# Patient Record
Sex: Female | Born: 1965
Health system: Southern US, Community
[De-identification: ages and names within clinical notes are randomized; demographics above are authoritative.]

## PROBLEM LIST (undated history)

## (undated) DIAGNOSIS — I10 Essential (primary) hypertension: Secondary | ICD-10-CM

## (undated) DIAGNOSIS — J45909 Unspecified asthma, uncomplicated: Secondary | ICD-10-CM

## (undated) DIAGNOSIS — D571 Sickle-cell disease without crisis: Secondary | ICD-10-CM

## (undated) DIAGNOSIS — T7840XA Allergy, unspecified, initial encounter: Secondary | ICD-10-CM

## (undated) DIAGNOSIS — D573 Sickle-cell trait: Secondary | ICD-10-CM

## (undated) DIAGNOSIS — D649 Anemia, unspecified: Secondary | ICD-10-CM

## (undated) DIAGNOSIS — E079 Disorder of thyroid, unspecified: Secondary | ICD-10-CM

## (undated) DIAGNOSIS — K219 Gastro-esophageal reflux disease without esophagitis: Secondary | ICD-10-CM

## (undated) DIAGNOSIS — C801 Malignant (primary) neoplasm, unspecified: Secondary | ICD-10-CM

## (undated) HISTORY — DX: Sickle-cell disease without crisis: D57.1

## (undated) HISTORY — PX: ABDOMINAL HYSTERECTOMY: SHX81

## (undated) HISTORY — DX: Malignant (primary) neoplasm, unspecified: C80.1

## (undated) HISTORY — DX: Allergy, unspecified, initial encounter: T78.40XA

## (undated) HISTORY — DX: Sickle-cell trait: D57.3

## (undated) HISTORY — DX: Anemia, unspecified: D64.9

## (undated) HISTORY — PX: THYROID SURGERY: SHX805

## (undated) HISTORY — DX: Gastro-esophageal reflux disease without esophagitis: K21.9

## (undated) HISTORY — PX: NASAL SINUS SURGERY: SHX719

## (undated) HISTORY — DX: Unspecified asthma, uncomplicated: J45.909

## (undated) HISTORY — PX: OTHER SURGICAL HISTORY: SHX169

---

## 2006-09-06 ENCOUNTER — Emergency Department (HOSPITAL_COMMUNITY): Admission: EM | Admit: 2006-09-06 | Discharge: 2006-09-07 | Payer: Self-pay | Admitting: Emergency Medicine

## 2007-06-10 ENCOUNTER — Emergency Department (HOSPITAL_COMMUNITY): Admission: EM | Admit: 2007-06-10 | Discharge: 2007-06-10 | Payer: Self-pay | Admitting: Emergency Medicine

## 2008-06-25 ENCOUNTER — Emergency Department (HOSPITAL_COMMUNITY): Admission: EM | Admit: 2008-06-25 | Discharge: 2008-06-26 | Payer: Self-pay | Admitting: Emergency Medicine

## 2010-08-06 ENCOUNTER — Emergency Department (HOSPITAL_COMMUNITY): Admission: EM | Admit: 2010-08-06 | Discharge: 2010-08-06 | Payer: Self-pay | Admitting: Emergency Medicine

## 2011-02-11 ENCOUNTER — Observation Stay (HOSPITAL_COMMUNITY)
Admission: EM | Admit: 2011-02-11 | Discharge: 2011-02-12 | Disposition: A | Discharge status: Home / Self Care | Payer: Medicare Other | Attending: Family Medicine | Admitting: Family Medicine

## 2011-02-11 ENCOUNTER — Encounter: Payer: Self-pay | Admitting: Family Medicine

## 2011-02-11 ENCOUNTER — Emergency Department (HOSPITAL_COMMUNITY): Payer: Self-pay

## 2011-02-11 DIAGNOSIS — R0789 Other chest pain: Secondary | ICD-10-CM

## 2011-02-11 DIAGNOSIS — R0602 Shortness of breath: Secondary | ICD-10-CM | POA: Insufficient documentation

## 2011-02-11 DIAGNOSIS — F17200 Nicotine dependence, unspecified, uncomplicated: Secondary | ICD-10-CM

## 2011-02-11 DIAGNOSIS — E039 Hypothyroidism, unspecified: Secondary | ICD-10-CM | POA: Insufficient documentation

## 2011-02-11 DIAGNOSIS — D571 Sickle-cell disease without crisis: Secondary | ICD-10-CM

## 2011-02-11 DIAGNOSIS — R42 Dizziness and giddiness: Secondary | ICD-10-CM | POA: Insufficient documentation

## 2011-02-11 DIAGNOSIS — A599 Trichomoniasis, unspecified: Secondary | ICD-10-CM | POA: Insufficient documentation

## 2011-02-11 DIAGNOSIS — R110 Nausea: Secondary | ICD-10-CM | POA: Insufficient documentation

## 2011-02-11 LAB — CARDIAC PANEL(CRET KIN+CKTOT+MB+TROPI)
CK, MB: 1.7 ng/mL (ref 0.3–4.0)
Relative Index: 1.3 (ref 0.0–2.5)
Total CK: 130 U/L (ref 7–177)

## 2011-02-11 LAB — URINE MICROSCOPIC-ADD ON

## 2011-02-11 LAB — DIFFERENTIAL
Basophils Absolute: 0.1 10*3/uL (ref 0.0–0.1)
Basophils Relative: 1 % (ref 0–1)
Eosinophils Absolute: 0.5 10*3/uL (ref 0.0–0.7)
Eosinophils Relative: 7 % — ABNORMAL HIGH (ref 0–5)
Lymphocytes Relative: 39 % (ref 12–46)
Lymphs Abs: 2.7 10*3/uL (ref 0.7–4.0)
Monocytes Absolute: 0.4 10*3/uL (ref 0.1–1.0)
Monocytes Relative: 6 % (ref 3–12)
Neutro Abs: 3.3 10*3/uL (ref 1.7–7.7)
Neutrophils Relative %: 47 % (ref 43–77)

## 2011-02-11 LAB — POCT CARDIAC MARKERS
CKMB, poc: <1 ng/mL — ABNORMAL LOW (ref 1.0–8.0)
CKMB, poc: 1.8 ng/mL (ref 1.0–8.0)
Myoglobin, poc: 54.1 ng/mL (ref 12–200)
Myoglobin, poc: 68 ng/mL (ref 12–200)
Troponin i, poc: <0.05 ng/mL (ref 0.00–0.09)
Troponin i, poc: <0.05 ng/mL (ref 0.00–0.09)

## 2011-02-11 LAB — BASIC METABOLIC PANEL
BUN: 9 mg/dL (ref 6–23)
CO2: 26 mEq/L (ref 19–32)
Calcium: 10.1 mg/dL (ref 8.4–10.5)
Chloride: 108 mEq/L (ref 96–112)
Creatinine, Ser: 0.73 mg/dL (ref 0.4–1.2)
GFR calc non Af Amer: >60 mL/min (ref >60)
Glucose, Bld: 91 mg/dL (ref 70–99)
Potassium: 3.7 mEq/L (ref 3.5–5.1)
Sodium: 141 mEq/L (ref 135–145)

## 2011-02-11 LAB — URINALYSIS, ROUTINE W REFLEX MICROSCOPIC
Bilirubin Urine: NEGATIVE
Hgb urine dipstick: NEGATIVE
Ketones, ur: NEGATIVE mg/dL
Nitrite: NEGATIVE
Protein, ur: NEGATIVE mg/dL
Specific Gravity, Urine: 1.011 (ref 1.005–1.030)
Urine Glucose, Fasting: NEGATIVE mg/dL
Urobilinogen, UA: 0.2 mg/dL (ref 0.0–1.0)
pH: 6.5 (ref 5.0–8.0)

## 2011-02-11 LAB — CBC
HCT: 35.9 % — ABNORMAL LOW (ref 36.0–46.0)
Hemoglobin: 12.3 g/dL (ref 12.0–15.0)
MCH: 28.3 pg (ref 26.0–34.0)
MCHC: 34.3 g/dL (ref 30.0–36.0)
MCV: 82.5 fL (ref 78.0–100.0)
Platelets: 451 10*3/uL — ABNORMAL HIGH (ref 150–400)
RBC: 4.35 MIL/uL (ref 3.87–5.11)
RDW: 13.1 % (ref 11.5–15.5)
WBC: 6.9 10*3/uL (ref 4.0–10.5)

## 2011-02-11 LAB — TSH: TSH: 17.05 u[IU]/mL — ABNORMAL HIGH (ref 0.350–4.500)

## 2011-02-11 LAB — RETICULOCYTES
RBC.: 4.3 MIL/uL (ref 3.87–5.11)
Retic Count, Absolute: 38.7 10*3/uL (ref 19.0–186.0)
Retic Ct Pct: 0.9 % (ref 0.4–3.1)

## 2011-02-11 LAB — HEPATIC FUNCTION PANEL
ALT: 15 U/L (ref 0–35)
AST: 19 U/L (ref 0–37)
Albumin: 4 g/dL (ref 3.5–5.2)
Alkaline Phosphatase: 52 U/L (ref 39–117)
Bilirubin, Direct: <0.1 mg/dL (ref 0.0–0.3)
Total Bilirubin: 0.2 mg/dL — ABNORMAL LOW (ref 0.3–1.2)
Total Protein: 7.2 g/dL (ref 6.0–8.3)

## 2011-02-11 LAB — RAPID URINE DRUG SCREEN, HOSP PERFORMED
Amphetamines: NOT DETECTED
Benzodiazepines: NOT DETECTED
Cocaine: NOT DETECTED
Opiates: NOT DETECTED
Tetrahydrocannabinol: NOT DETECTED

## 2011-02-11 LAB — CK TOTAL AND CKMB (NOT AT ARMC)
CK, MB: 1.9 ng/mL (ref 0.3–4.0)
Relative Index: 1.4 (ref 0.0–2.5)
Total CK: 135 U/L (ref 7–177)

## 2011-02-11 LAB — TROPONIN I

## 2011-02-12 LAB — CBC
HCT: 36.9 % (ref 36.0–46.0)
Hemoglobin: 12.4 g/dL (ref 12.0–15.0)
MCH: 27.9 pg (ref 26.0–34.0)
MCHC: 33.6 g/dL (ref 30.0–36.0)
MCV: 82.9 fL (ref 78.0–100.0)
Platelets: 432 10*3/uL — ABNORMAL HIGH (ref 150–400)
RBC: 4.45 MIL/uL (ref 3.87–5.11)
RDW: 13.4 % (ref 11.5–15.5)
WBC: 4.7 10*3/uL (ref 4.0–10.5)

## 2011-02-12 LAB — LIPID PANEL
HDL: 36 mg/dL — ABNORMAL LOW (ref >39)
Total CHOL/HDL Ratio: 5.1 RATIO
Triglycerides: 185 mg/dL — ABNORMAL HIGH (ref <150)
VLDL: 37 mg/dL (ref 0–40)

## 2011-02-12 LAB — CARDIAC PANEL(CRET KIN+CKTOT+MB+TROPI)
CK, MB: 1.5 ng/mL (ref 0.3–4.0)
Relative Index: 1.3 (ref 0.0–2.5)
Total CK: 112 U/L (ref 7–177)

## 2011-02-12 LAB — RPR: RPR Ser Ql: NONREACTIVE

## 2011-02-12 LAB — HIV ANTIBODY (ROUTINE TESTING W REFLEX): HIV: NONREACTIVE

## 2011-02-23 NOTE — H&P (Signed)
NAMETACEY, DIMAGGIO NO.:  [id number]  MEDICAL RECORD NO.:  [id number]           PATIENT TYPE:  I  LOCATION:  2027                         FACILITY:  [facility]  PHYSICIAN:  Santiago Bumpers. Eldon Zietlow, M.D.DATE OF BIRTH:  [birth year]  DATE OF ADMISSION:  02/11/2011 DATE OF DISCHARGE:                             HISTORY & PHYSICAL   PCP:  Dr. Andrey Spearman at [facility] in [place].  CHIEF COMPLAINT:  Left-sided chest pain.  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  The patient describes the pain as feeling like pressure and sometimes sharp.  She states that the pain radiated to her left arm and to her back and her arm started to tingle.  She has associated nausea and has vomited twice today.  She drove home and took 4 tablets of aspirin and then came to the ER.  In the ER, she was given nitroglycerin sublingual and Zofran.  She states that her chest pain subsided, but is still present currently.  She does state that chest pain is relieved with belching.  PAST MEDICAL HISTORY: 1. Sickle cell disease.  The patient is not sure if she has sickle     cell trait or sickle cell disease and she is not sure which sickle     cell she has.  Her last hospitalization for pain crisis was in     February 2011 at [facility].  She has been admitted     about three times in the past 5 years for sickle cell crisis.  She     states that her pain during the crisis is surrounded by her entire     body and this pain does not feel like her normal sickle cell pain. 2. Hypothyroidism status post thyroidectomy.  PAST SURGICAL HISTORY: 1. Hysterectomy in 2002 secondary to fibroids. 2. Thyroidectomy in 2002. 3. Bilateral tubal ligation in 1992. 4. Sinus surgery in  2002.  MEDICATIONS: 1. Levothyroxine 125 mcg p.o. daily. 2. Hydroxyurea that the patient stop taking 1 year ago. 3. Vitamin D 50,000 units every Friday.  ALLERGIES:  THE PATIENT IS NOT SURE OF HER ALLERGY, BUT SHE IS ALLERGIC TO AN ANTIBIOTIC THAT STARTS WITH C AND THINKS IT IS CIPRO.  SOCIAL HISTORY:  The patient lives in [place] with her son and daughter.  She has a total of 4 children.  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  Tobacco use:  The patient smokes 5-6 cigarettes a day for 23 years.  The patient endorses occasional liquor.  Drugs:  No drugs.  FAMILY HISTORY:  The patient's mother is 56 years old and has CHF, COPD, fibromyalgia, and hypertension.  The patient's father is 3 years old and has end-stage renal disease on hemodialysis.  The patient has a sister who is healthy.  REVIEW OF SYSTEMS:  GENERAL:  Denies fevers or chills.  HEENT:  Denies headaches  or sore throat.  Cardiovascular:  Endorses chest pain.  Denies edema, orthopnea, or palpitations.  RESPIRATORY:  Denies cough, dyspnea, or wheezing.  GI:  Endorses nausea and vomiting.  Denies diarrhea, constipation, or melena.  Last bowel movement was last night, which was normal.  SKIN:  Denies rashes or ecchymoses.  MUSCULOSKELETAL:  Denies myalgia or swelling.  NEURO:  Denies visual changes, weakness, or numbness.  HEME:  Denies bleeding or easy bruising.  ENDOCRINE:  Denies polyuria or polydipsia.  PHYSICAL EXAM:  VITAL SIGNS:  Temperature 98.1, pulse 74, respiratory rate 18, blood pressure 138/97, O2 sat 100% room air. GENERAL:  No apparent distress, appropriate throughout exam. HEENT:  Normocephalic, atraumatic.  Extraocular motility intact.  Pupils equal, round, reactive to light and accommodation.  Moist mucous membranes. NECK:  No cervical nodes.  No masses.  Full range of motion. CHEST:  Palpable tenderness to the left side of the chest. CARDIOVASCULAR:  Regular  rate and rhythm.  S1 and S2.  No gallops or murmurs.  No bruits. LUNGS:  Clear to auscultation bilaterally.  No wheezing, rales, or rhonchi. ABDOMEN:  Nontender, nondistended.  Positive bowel sounds.  No masses. BACK:  Nontender to palpation. EXTREMITIES:  No cyanosis, clubbing, or edema.  +3 pulses. NEURO:  Alert and oriented x3, nonfocal.  STUDIES AND LABS:  CBC with white blood cell 6.9, hemoglobin 12.3, hematocrit 35.9, platelets 451, ANC of 3.3, and MCV of 82.5.  CMET showed sodium 141, potassium 3.7, chloride 108, CO2 of 26, BUN 9, creatinine 0.7, glucose 91, calcium 10.1, total bilirubin 0.2, direct bilirubin less than 0.1, alkaline phosphatase 52, total protein 7.2, albumin 4.0, AST 19, ALT 4.0.  Retic with 0.9% retic and absolute retic count of 38.7.  Point-of-care cardiac enzymes x2 were negative. Urinalysis shows positive for small leuks with urinary micro that shows few squamous epithelium, 7-10 white blood cell, rare bacteria, positive for trace.  Chest x-ray showed no active cardiopulmonary disease.  EKG with heart rate 77, normal sinus rhythm.  No ST wave abnormality.  ASSESSMENT/PLAN:  This is a 45 year old female with sickle cell disease, who has left-sided chest pain. 1. Left-sided chest pain.  Differential diagnosis include ACS versus     infection versus stress versus musculoskeletal pain.  The patient     has two point-of-care cardiac enzymes done that are negative, an     EKG that does not show ST wave abnormalities, and a chest x-ray     that did not show any active cardiopulmonary disease.  We will     admit the patient to telemetry and cycle cardiac enzymes x2 set and     repeat EKG in the morning.  We will get an urinary drug screen and     if negative, we will consider adding a beta-blocker for     cardioprotection.  We will also get a fasting lipid panel in the     morning for risk stratification.  The patient will be placed on 81     mg of aspirin  daily. 2. Hypothyroidism.  The patient is taking levothyroxine 125 mcg daily.     We will continue this and check TSH. 3. Sickle cell disease.  Hemoglobin is stable.  Retic count is within     normal limits.  The patient is currently not considered to be in     pain crisis.  We will continue to monitor the patient.  We will not     restart hydroxyurea since the  patient has stopped taking this a     year ago. 4. Trichomonas.  The patient has positive Trichomonas in urine.  We     will treat with Flagyl 2000 mg p.o. x1.  We will also check for     other sexually transmitted diseases including human     immunodeficiency virus, syphilis, Chlamydia, and gonorrhea.  We     will discuss with the patient that she should get her partner     treated or else she is at risk of re-infection. 5. Fluids, electrolytes, nutrition/gastrointestinal.  Heart-healthy     diet.  Hep-Lock IV. 6. Deep venous thrombosis prophylaxis.  Heparin 5000 units subcu q.8     h. 7. Code status.  The patient is full code. 8. Disposition likely in the morning after ACS has been ruled out.     Cat Ta, MD   ______________________________ Santiago Bumpers Leveda Anna, M.D.    CT/MEDQ  D:  02/11/2011  T:  02/11/2011  Job:  [id number]  cc:   Dr. Andrey Spearman  Electronically Signed by CAT TA MD on 02/19/2011 10:40:49 AM Electronically Signed by Doralee Albino M.D. on 02/23/2011 10:22:10 AM

## 2011-03-02 NOTE — Discharge Summary (Signed)
Lisa Mullen, Lisa Mullen NO.:  [id number]  MEDICAL RECORD NO.:  [id number]           PATIENT TYPE:  I  LOCATION:  2027                         FACILITY:  [facility]  PHYSICIAN:  Santiago Bumpers. Leondra Cullin, M.D.DATE OF BIRTH:  [birth year]  DATE OF ADMISSION:  02/11/2011 DATE OF DISCHARGE:  02/12/2011                              DISCHARGE SUMMARY   PRIMARY CARE PROVIDER:  [facility] in [place], [place].  DISCHARGE DIAGNOSES: 1. Musculoskeletal chest pain, noncardiac. 2. Hypothyroidism, uncontrolled. 3. Tobacco dependence.  DISCHARGE MEDICATIONS: 1. Aspirin 81 mg p.o. daily. 2. Levothyroxine 125 mcg p.o. daily. 3. Vicodin 500 mg p.o. q. 4 p.r.n. for pain.  PERTINENT LAB VALUES:  On February 11, 2011, CBC with differential was unremarkable.  Basic metabolic panel was unremarkable.  Hepatic function panel was unremarkable.  Cardiac enzymes on February 11, 2011, point-of- care cardiac enzymes, CK-MB less than 1, troponin-I  less than 0.05, myoglobin 54.1.  Second set of cardiac enzymes on February 11, 2011, CK 135, CK-MB 1.9, relative index 1.4, troponin-I 0.02.  Third set of cardiac enzymes on February 11, 2011, CK 130, CK-MB 1.7, relative index 1.3, troponin-I less than 0.01. On February 11, 2011, a TSH was 17.050. February 12, 2011, fasting lipid panel, total cholesterol 182, triglycerides 185, HDL 36, LDL 109, VLDL 37, ratio 5.1.  Radiology on February 11, 2011, a two-view chest x-ray shows no active cardiopulmonary disease.  BRIEF HOSPITAL COURSE:  Ms. Huseman is a 45 year old female with past medical history of hemoglobin [place] disease, who presented to the hospital complaining of chest pain and right neck pain. 1. Chest pain.  The patient was admitted for rule out acute coronary     syndrome.  Her EKG within normal limits.  Her chest x-ray showed no     acute findings.  Cardiac enzymes were cycled and were negative x3.     Her chest pain resolved  during hospitalization.  She was risk     stratified and her lipid panel showed a low HDL and mildly elevated     LDL.  We would recommend diet and exercise for control of mild     hyperlipidemia.  The patient is also a smoker, which is her biggest     risk factor for coronary artery disease.  She is trying to quit and     received positive reinforcement for trying to quit as well as     advised her smoking cessation. 2. Hypothyroidism.  The patient has a history of hypothyroidism and     had an elevated TSH on admission showing poor control.  The patient     said that she had only recently had an increase in her     levothyroxine dose and also admits to occasionally missing doses     due to a hectic work schedule.  We would advise her PCP to follow     up on her TSH and try to achieve better control of her     hypothyroidism as this could be a cause of her presenting symptoms     to the hospital. 3.  At risk for STDs.  The patient's urinalysis showed Trichomonas, but     no other abnormalities.  She was treated here in the hospital for     Trichomonas with 2 g of Flagyl x1.  We would recommend her PCP     follow up for other STD testing and treatment if necessary. 4. Stress.  The patient reports being under a lot of stress lately,     working many hours a week as well as having some legal issues.  We     would just recommend that her primary care provider follow up on     her stress level and assist her with management.  The patient was     discharged to home in stable medical condition.    ______________________________ Ardyth Gal, MD   ______________________________ Santiago Bumpers. Leveda Anna, M.D.    CR/MEDQ  D:  02/12/2011  T:  02/13/2011  Job:  [id number]  cc:   [facility]  Electronically Signed by Ardyth Gal MD on 03/01/2011 12:04:40 PM Electronically Signed by Doralee Albino M.D. on 03/02/2011 09:09:30 AM

## 2011-09-17 LAB — POCT I-STAT, CHEM 8
BUN: 8
Calcium, Ion: 1.21
Chloride: 107
Creatinine, Ser: 0.9
Glucose, Bld: 83
HCT: 46
Hemoglobin: 15.6 — ABNORMAL HIGH
Potassium: 3.6
Sodium: 141
TCO2: 26

## 2011-09-17 LAB — URINALYSIS, ROUTINE W REFLEX MICROSCOPIC
Bilirubin Urine: NEGATIVE
Glucose, UA: NEGATIVE
Ketones, ur: NEGATIVE
Leukocytes, UA: NEGATIVE
Nitrite: NEGATIVE
Protein, ur: NEGATIVE
Specific Gravity, Urine: 1.014
Urobilinogen, UA: 1
pH: 5.5

## 2011-09-17 LAB — DIFFERENTIAL
Basophils Absolute: 0
Basophils Relative: 0
Eosinophils Absolute: 0.4
Eosinophils Relative: 8 — ABNORMAL HIGH
Lymphocytes Relative: 13
Lymphs Abs: 0.7
Monocytes Absolute: 0.3
Monocytes Relative: 5
Neutro Abs: 4.2
Neutrophils Relative %: 74

## 2011-09-17 LAB — CBC
HCT: 42.4
Hemoglobin: 14.2
MCHC: 33.4
MCV: 84.8
Platelets: 386
RBC: 5
RDW: 13.9
WBC: 5.6

## 2011-09-17 LAB — RETICULOCYTES
RBC.: 4.95
Retic Count, Absolute: 34.7
Retic Ct Pct: 0.7

## 2011-09-17 LAB — URINE MICROSCOPIC-ADD ON

## 2011-10-07 LAB — CBC
HCT: 38.4
Hemoglobin: 12.6
MCHC: 33
MCV: 86.1
Platelets: 447 — ABNORMAL HIGH
RBC: 4.45
RDW: 13.1
WBC: 7.4

## 2011-10-07 LAB — DIFFERENTIAL
Basophils Absolute: 0
Basophils Relative: 1
Eosinophils Absolute: 0.7
Eosinophils Relative: 9 — ABNORMAL HIGH
Lymphocytes Relative: 41
Lymphs Abs: 3
Monocytes Absolute: 0.5
Monocytes Relative: 7
Neutro Abs: 3.1
Neutrophils Relative %: 42 — ABNORMAL LOW

## 2012-03-14 ENCOUNTER — Observation Stay (HOSPITAL_COMMUNITY)
Admission: EM | Admit: 2012-03-14 | Discharge: 2012-03-15 | Disposition: A | Discharge status: Home / Self Care | Payer: BC Managed Care – PPO | Attending: Emergency Medicine | Admitting: Emergency Medicine

## 2012-03-14 ENCOUNTER — Other Ambulatory Visit: Payer: Self-pay

## 2012-03-14 ENCOUNTER — Emergency Department (HOSPITAL_COMMUNITY): Payer: BC Managed Care – PPO

## 2012-03-14 DIAGNOSIS — R0602 Shortness of breath: Secondary | ICD-10-CM | POA: Insufficient documentation

## 2012-03-14 DIAGNOSIS — R209 Unspecified disturbances of skin sensation: Secondary | ICD-10-CM | POA: Insufficient documentation

## 2012-03-14 DIAGNOSIS — R079 Chest pain, unspecified: Principal | ICD-10-CM | POA: Insufficient documentation

## 2012-03-14 LAB — BASIC METABOLIC PANEL
BUN: 13 mg/dL (ref 6–23)
CO2: 23 mEq/L (ref 19–32)
Calcium: 9.3 mg/dL (ref 8.4–10.5)
Chloride: 103 mEq/L (ref 96–112)
Creatinine, Ser: 0.74 mg/dL (ref 0.50–1.10)
GFR calc Af Amer: >90 mL/min (ref >90)
GFR calc non Af Amer: >90 mL/min (ref >90)
Glucose, Bld: 95 mg/dL (ref 70–99)
Potassium: 3.6 mEq/L (ref 3.5–5.1)
Sodium: 137 mEq/L (ref 135–145)

## 2012-03-14 LAB — TROPONIN I: Troponin I: <0.3 ng/mL (ref <0.30)

## 2012-03-14 LAB — POCT I-STAT TROPONIN I: Troponin i, poc: 0 ng/mL (ref 0.00–0.08)

## 2012-03-14 LAB — CBC
HCT: 36.7 % (ref 36.0–46.0)
Hemoglobin: 12.8 g/dL (ref 12.0–15.0)
MCH: 28.6 pg (ref 26.0–34.0)
MCHC: 34.9 g/dL (ref 30.0–36.0)
MCV: 81.9 fL (ref 78.0–100.0)
Platelets: 391 10*3/uL (ref 150–400)
RBC: 4.48 MIL/uL (ref 3.87–5.11)
RDW: 13.3 % (ref 11.5–15.5)
WBC: 6.8 10*3/uL (ref 4.0–10.5)

## 2012-03-14 LAB — DIFFERENTIAL
Basophils Absolute: 0.1 10*3/uL (ref 0.0–0.1)
Basophils Relative: 1 % (ref 0–1)
Eosinophils Absolute: 0.5 10*3/uL (ref 0.0–0.7)
Eosinophils Relative: 8 % — ABNORMAL HIGH (ref 0–5)
Lymphocytes Relative: 41 % (ref 12–46)
Lymphs Abs: 2.8 10*3/uL (ref 0.7–4.0)
Monocytes Absolute: 0.5 10*3/uL (ref 0.1–1.0)
Monocytes Relative: 7 % (ref 3–12)
Neutro Abs: 2.9 10*3/uL (ref 1.7–7.7)
Neutrophils Relative %: 43 % (ref 43–77)

## 2012-03-14 MED ORDER — MORPHINE SULFATE 4 MG/ML IJ SOLN
INTRAMUSCULAR | Status: AC
  Administered 2012-03-14: 4 mg via INTRAVENOUS
  Filled 2012-03-14: qty 1

## 2012-03-14 MED ORDER — ACETAMINOPHEN 325 MG PO TABS
650.0000 mg | ORAL_TABLET | Freq: Once | ORAL | Status: AC
  Administered 2012-03-14: 650 mg via ORAL
  Filled 2012-03-14: qty 2

## 2012-03-14 MED ORDER — MORPHINE SULFATE 4 MG/ML IJ SOLN
4.0000 mg | Freq: Once | INTRAMUSCULAR | Status: AC
  Administered 2012-03-14: 4 mg via INTRAVENOUS

## 2012-03-14 MED ORDER — ASPIRIN 81 MG PO CHEW
324.0000 mg | CHEWABLE_TABLET | Freq: Once | ORAL | Status: AC
  Administered 2012-03-14: 324 mg via ORAL
  Filled 2012-03-14: qty 4

## 2012-03-14 MED ORDER — NITROGLYCERIN 0.4 MG SL SUBL
0.4000 mg | SUBLINGUAL_TABLET | SUBLINGUAL | Status: AC | PRN
  Administered 2012-03-14 (×3): 0.4 mg via SUBLINGUAL
  Filled 2012-03-14: qty 25

## 2012-03-14 NOTE — ED Notes (Signed)
MD in to reassess - plan of care discussed

## 2012-03-14 NOTE — ED Notes (Signed)
[place] sandwich, pretzels, apple sauce, cookies, sprite in ice given; awaiting CDU bed availability - pt aware of same

## 2012-03-14 NOTE — ED Notes (Signed)
Patient transported to X-ray 

## 2012-03-14 NOTE — ED Provider Notes (Signed)
History     CSN: [id number]  Arrival date & time 03/14/12  1339   First MD Initiated Contact with Patient 03/14/12 1654      Chief Complaint  Patient presents with  . Chest Pain    SHOB, tingling in LT arm    (Consider location/radiation/quality/duration/timing/severity/associated sxs/prior treatment) Patient is a 46 y.o. female presenting with chest pain. The history is provided by the patient.  Chest Pain The chest pain began 12 - 24 hours ago. Chest pain occurs frequently. The chest pain is improving. The pain is currently at 5/10. The severity of the pain is moderate. The quality of the pain is described as pressure-like. The pain radiates to the left jaw, left shoulder and upper back. Primary symptoms include shortness of breath (intermittent). Pertinent negatives for primary symptoms include no fever, no cough, no abdominal pain, no nausea and no vomiting. She tried aspirin for the symptoms.     No past medical history on file.  No past surgical history on file.  No family history on file.  History  Substance Use Topics  . Smoking status: Not on file  . Smokeless tobacco: Not on file  . Alcohol Use: Not on file    OB History    No data available      Review of Systems  Constitutional: Negative for fever and chills.  HENT: Negative for congestion and rhinorrhea.   Respiratory: Positive for shortness of breath (intermittent). Negative for cough.   Cardiovascular: Positive for chest pain. Negative for leg swelling.  Gastrointestinal: Negative for nausea, vomiting, abdominal pain and constipation.  Genitourinary: Negative for urgency, decreased urine volume and difficulty urinating.  Skin: Negative for wound.  Neurological: Negative for headaches.  Psychiatric/Behavioral: Negative for confusion.  All other systems reviewed and are negative.    Allergies  Ciprofloxacin  Home Medications   Current Outpatient Rx  Name Route Sig Dispense Refill  . ASPIRIN 81  MG PO TABS Oral Take 324 mg by mouth once.    Marland Kitchen LEVOTHYROXINE SODIUM 150 MCG PO TABS Oral Take 150 mcg by mouth daily.      BP 123/81  Pulse 89  Temp(Src) 98.2 F (36.8 C) (Oral)  SpO2 99%  Physical Exam  Nursing note and vitals reviewed. Constitutional: She is oriented to person, place, and time. She appears well-developed and well-nourished. No distress.  HENT:  Head: Normocephalic and atraumatic.  Right Ear: External ear normal.  Left Ear: External ear normal.  Nose: Nose normal.  Mouth/Throat: Oropharynx is clear and moist.  Neck: Neck supple.  Cardiovascular: Normal rate, regular rhythm, normal heart sounds and intact distal pulses.   Pulmonary/Chest: Effort normal and breath sounds normal. No respiratory distress. She has no wheezes. She has no rales.         TTP in left upper chest, worse with movement of left shoulder  Abdominal: Soft. She exhibits no distension. There is no tenderness.  Musculoskeletal: She exhibits no edema.  Lymphadenopathy:    She has no cervical adenopathy.  Neurological: She is alert and oriented to person, place, and time.  Skin: Skin is warm and dry. She is not diaphoretic. No pallor.    ED Course  Procedures (including critical care time)  Labs Reviewed  DIFFERENTIAL - Abnormal; Notable for the following:    Eosinophils Relative 8 (*)    All other components within normal limits  CBC  BASIC METABOLIC PANEL  TROPONIN I  POCT I-STAT TROPONIN I   Dg Chest 2  View  03/14/2012  *RADIOLOGY REPORT*  Clinical Data: Left-sided chest pain and tightness.  Blurry vision. Vomiting.  Shortness of breath.  CHEST - 2 VIEW  Comparison: 02/11/2011  Findings: Cardiomediastinal silhouette is within normal limits. The lungs are free of focal consolidations and pleural effusions. No edema. Visualized osseous structures have a normal appearance.  IMPRESSION: No evidence for acute cardiopulmonary abnormality.  Original Report Authenticated By: Patterson Hammersmith, M.D.     Date: 03/14/2012  Rate: 83  Rhythm: normal sinus rhythm  QRS Axis: normal  Intervals: normal  ST/T Wave abnormalities: normal  Conduction Disutrbances:none  Narrative Interpretation:   Old EKG Reviewed: none available   1. Chest pain       MDM  45 yo female with intermittent chest pressure for past 15 hours. Not worse with exertion. EKG ok, trop neg. No PNA or PTX. Pain better with NTG and morphine. Doubt ACS based on neg trop and norm EKG, but due to risk factors with family history, will place in CDU obs for rule out of troponin and likely a coronary CT        Pricilla Loveless, MD 03/14/12 2338

## 2012-03-14 NOTE — ED Provider Notes (Signed)
1930  Report received from Dr. Judd Lien.  Patient will be coming to CDU for CP protocol.  For CT heart in am. BMI 29.7.   Pain in upper back presently.  11:30pm  Report given to Dr. Judd Lien.    Jethro Bastos, NP 03/15/12 [phone]

## 2012-03-14 NOTE — ED Notes (Addendum)
Patient transported from X-ray 

## 2012-03-14 NOTE — ED Notes (Signed)
Pain to upper chest - onset 0100 this am while at work; states pain rad. Into bilat shoulder blades; states then "things started getting foggy" and she felt as if she was going to pass out; also endorses n/v, sob and diaphoresis; neg cardiac hx per pt; states  Pain presently 7/10 described as "pressure" - worse in upper to mid back area

## 2012-03-15 ENCOUNTER — Observation Stay (HOSPITAL_COMMUNITY): Payer: BC Managed Care – PPO

## 2012-03-15 ENCOUNTER — Encounter (HOSPITAL_COMMUNITY): Payer: Self-pay

## 2012-03-15 ENCOUNTER — Other Ambulatory Visit: Payer: Self-pay

## 2012-03-15 LAB — POCT I-STAT TROPONIN I: Troponin i, poc: 0 ng/mL (ref 0.00–0.08)

## 2012-03-15 MED ORDER — METOPROLOL TARTRATE 1 MG/ML IV SOLN
5.0000 mg | Freq: Once | INTRAVENOUS | Status: AC
  Administered 2012-03-15: 5 mg via INTRAVENOUS

## 2012-03-15 MED ORDER — METOPROLOL TARTRATE 1 MG/ML IV SOLN
INTRAVENOUS | Status: AC
  Filled 2012-03-15: qty 10

## 2012-03-15 MED ORDER — NITROGLYCERIN 0.4 MG SL SUBL
SUBLINGUAL_TABLET | SUBLINGUAL | Status: AC
  Filled 2012-03-15: qty 25

## 2012-03-15 MED ORDER — NITROGLYCERIN 0.4 MG SL SUBL
0.4000 mg | SUBLINGUAL_TABLET | Freq: Once | SUBLINGUAL | Status: AC
  Administered 2012-03-15: 0.4 mg via SUBLINGUAL

## 2012-03-15 MED ORDER — METOPROLOL TARTRATE 25 MG PO TABS
ORAL_TABLET | ORAL | Status: AC
  Administered 2012-03-15: 50 mg
  Filled 2012-03-15: qty 2

## 2012-03-15 MED ORDER — METOPROLOL TARTRATE 25 MG PO TABS
50.0000 mg | ORAL_TABLET | Freq: Once | ORAL | Status: AC
  Administered 2012-03-15: 50 mg via ORAL
  Filled 2012-03-15: qty 2

## 2012-03-15 MED ORDER — IOHEXOL 350 MG/ML SOLN
80.0000 mL | Freq: Once | INTRAVENOUS | Status: AC | PRN
  Administered 2012-03-15: 80 mL via INTRAVENOUS

## 2012-03-15 NOTE — Progress Notes (Signed)
Observation review is complete. 

## 2012-03-15 NOTE — Discharge Instructions (Signed)
Chest Pain (Nonspecific) It is often hard to give a specific diagnosis for the cause of chest pain. There is always a chance that your pain could be related to something serious, such as a heart attack or a blood clot in the lungs. You need to follow up with your caregiver for further evaluation. CAUSES   Heartburn.   Pneumonia or bronchitis.   Anxiety or stress.   Inflammation around your heart (pericarditis) or lung (pleuritis or pleurisy).   A blood clot in the lung.   A collapsed lung (pneumothorax). It can develop suddenly on its own (spontaneous pneumothorax) or from injury (trauma) to the chest.   Shingles infection (herpes zoster virus).  The chest wall is composed of bones, muscles, and cartilage. Any of these can be the source of the pain.  The bones can be bruised by injury.   The muscles or cartilage can be strained by coughing or overwork.   The cartilage can be affected by inflammation and become sore (costochondritis).  DIAGNOSIS  Lab tests or other studies, such as X-rays, electrocardiography, stress testing, or cardiac imaging, may be needed to find the cause of your pain.  TREATMENT   Treatment depends on what may be causing your chest pain. Treatment may include:   Acid blockers for heartburn.   Anti-inflammatory medicine.   Pain medicine for inflammatory conditions.   Antibiotics if an infection is present.   You may be advised to change lifestyle habits. This includes stopping smoking and avoiding alcohol, caffeine, and chocolate.   You may be advised to keep your head raised (elevated) when sleeping. This reduces the chance of acid going backward from your stomach into your esophagus.   Most of the time, nonspecific chest pain will improve within 2 to 3 days with rest and mild pain medicine.  HOME CARE INSTRUCTIONS   If antibiotics were prescribed, take your antibiotics as directed. Finish them even if you start to feel better.   For the next few  days, avoid physical activities that bring on chest pain. Continue physical activities as directed.   Do not smoke.   Avoid drinking alcohol.   Only take over-the-counter or prescription medicine for pain, discomfort, or fever as directed by your caregiver.   Follow your caregiver's suggestions for further testing if your chest pain does not go away.   Keep any follow-up appointments you made. If you do not go to an appointment, you could develop lasting (chronic) problems with pain. If there is any problem keeping an appointment, you must call to reschedule.  SEEK MEDICAL CARE IF:   You think you are having problems from the medicine you are taking. Read your medicine instructions carefully.   Your chest pain does not go away, even after treatment.   You develop a rash with blisters on your chest.  SEEK IMMEDIATE MEDICAL CARE IF:   You have increased chest pain or pain that spreads to your arm, neck, jaw, back, or abdomen.   You develop shortness of breath, an increasing cough, or you are coughing up blood.   You have severe back or abdominal pain, feel nauseous, or vomit.   You develop severe weakness, fainting, or chills.   You have a fever.  THIS IS AN EMERGENCY. Do not wait to see if the pain will go away. Get medical help at once. Call your local emergency services (911 in [place] Do not drive yourself to the hospital. MAKE SURE YOU:   Understand these instructions.     Will watch your condition.   Will get help right away if you are not doing well or get worse.  Document Released: 09/16/2005 Document Revised: 11/26/2011 Document Reviewed: 07/12/2008 Exit[employer] Patient Information 2012 [employer]. 

## 2012-03-15 NOTE — ED Notes (Signed)
Pt up ambulatory to bathroom without any problems.  Pt continues to be pain free, family at bedside.

## 2012-03-15 NOTE — ED Notes (Signed)
Pt's IVs in left and right ACs d/c'd also from the monitor, continuous pulse oximetry and blood pressure cuff; pt getting dressed to be discharged home; family at bedside

## 2012-03-15 NOTE — ED Notes (Signed)
Patient returned to room. Tolerated ct well

## 2012-03-15 NOTE — ED Notes (Signed)
Pt resting with eyes closed.  Cardiac Monitor NSR.  Lights turned down call light at bedside, SL in place in left [facility]

## 2012-03-15 NOTE — ED Provider Notes (Signed)
Medical screening examination/treatment/procedure(s) were performed by non-physician practitioner and as supervising physician I was immediately available for consultation/collaboration.  Geoffery Lyons, MD 03/15/12 1229

## 2012-03-15 NOTE — ED Notes (Signed)
PT HEART RATE NOTED UP IN 70-80 WHEN AWAKE AND MOVING AROUND. SPOKE WITH KELLY IN CT AND WILL GIVE PT ADDITIONAL METOPROLOL TO EQUAL FULL DOSE FOR RATE CONTROL FOR CT STUDY SCHEDULED THIS MORNING

## 2012-03-15 NOTE — ED Notes (Signed)
BMI CALCULATED AT 29.7

## 2012-03-15 NOTE — ED Notes (Signed)
Pa has been in to discuss results with pt

## 2012-03-15 NOTE — ED Provider Notes (Signed)
I saw and evaluated the patient, reviewed the resident's note and I agree with the findings and plan.  I saw the patient along with Alric Ran and agree with his note, assessment, and plan.  The patient presents with chest pain.  On exam, the heart and lungs are unremarkable and abdomen is benign.  The workup was unremarkable including ekg, cardiac enzymes, and chest xray.  She will be placed in the CDU for rule out of mi and cardiac ct in the am.    Geoffery Lyons, MD 03/15/12 1325

## 2012-03-15 NOTE — ED Provider Notes (Signed)
CTA completely normal per Dr. Azell Der. Patient is asymptomatic and will be discharged home.  Rodena Medin, PA-C 03/15/12 1055

## 2012-08-06 ENCOUNTER — Encounter (HOSPITAL_COMMUNITY): Payer: Self-pay | Admitting: *Deleted

## 2012-08-06 ENCOUNTER — Emergency Department (HOSPITAL_COMMUNITY)
Admission: EM | Admit: 2012-08-06 | Discharge: 2012-08-06 | Disposition: A | Discharge status: Home / Self Care | Payer: BC Managed Care – PPO | Attending: Emergency Medicine | Admitting: Emergency Medicine

## 2012-08-06 DIAGNOSIS — E079 Disorder of thyroid, unspecified: Secondary | ICD-10-CM | POA: Insufficient documentation

## 2012-08-06 DIAGNOSIS — F17200 Nicotine dependence, unspecified, uncomplicated: Secondary | ICD-10-CM | POA: Insufficient documentation

## 2012-08-06 DIAGNOSIS — D571 Sickle-cell disease without crisis: Secondary | ICD-10-CM | POA: Insufficient documentation

## 2012-08-06 DIAGNOSIS — M25519 Pain in unspecified shoulder: Secondary | ICD-10-CM | POA: Insufficient documentation

## 2012-08-06 DIAGNOSIS — R109 Unspecified abdominal pain: Secondary | ICD-10-CM | POA: Insufficient documentation

## 2012-08-06 DIAGNOSIS — M62830 Muscle spasm of back: Secondary | ICD-10-CM

## 2012-08-06 HISTORY — DX: Disorder of thyroid, unspecified: E07.9

## 2012-08-06 HISTORY — DX: Sickle-cell disease without crisis: D57.1

## 2012-08-06 MED ORDER — IBUPROFEN 800 MG PO TABS
800.0000 mg | ORAL_TABLET | Freq: Once | ORAL | Status: AC
  Administered 2012-08-06: 800 mg via ORAL
  Filled 2012-08-06: qty 1

## 2012-08-06 MED ORDER — DIAZEPAM 5 MG PO TABS
5.0000 mg | ORAL_TABLET | Freq: Once | ORAL | Status: AC
  Administered 2012-08-06: 5 mg via ORAL
  Filled 2012-08-06: qty 1

## 2012-08-06 MED ORDER — IBUPROFEN 800 MG PO TABS: 800.0000 mg | ORAL_TABLET | Freq: Three times a day (TID) | ORAL | Status: AC

## 2012-08-06 MED ORDER — DIAZEPAM 5 MG PO TABS: 5.0000 mg | ORAL_TABLET | Freq: Three times a day (TID) | ORAL | Status: AC | PRN

## 2012-08-06 MED ORDER — OXYCODONE-ACETAMINOPHEN 5-325 MG PO TABS
2.0000 | ORAL_TABLET | Freq: Once | ORAL | Status: AC
  Administered 2012-08-06: 2 via ORAL
  Filled 2012-08-06: qty 2

## 2012-08-06 MED ORDER — OXYCODONE-ACETAMINOPHEN 5-325 MG PO TABS: 2.0000 | ORAL_TABLET | ORAL | Status: AC | PRN
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
## 2012-08-06 NOTE — ED Notes (Addendum)
C/o L shoulder pain & neck pain. Also L scapula, L axilla throbbing, describes as severe, spasms and popping. Scattered and sporadic. Constant now. Worse with certain positions and with certain movements. Works as a Lawyer taking care of pts. Onset at work around 2130. Took 4 ibuprofen at 2130 & 4 more at 0130.

## 2012-08-06 NOTE — Progress Notes (Signed)
Orthopedic Tech Progress Note Patient Details:  Lisa Mullen 07-15-1966 [id number] Sling Immobilizer applied to Left UE, tolerated well Ortho Devices Type of Ortho Device: Sling immobilizer Ortho Device/Splint Location: Shoulder Sling Immobilizer applied to Left UE Ortho Device/Splint Interventions: Application   Lisa Mullen 08/06/2012, 6:34 AM

## 2012-12-04 ENCOUNTER — Emergency Department (HOSPITAL_COMMUNITY): Payer: BC Managed Care – PPO

## 2012-12-04 ENCOUNTER — Encounter (HOSPITAL_COMMUNITY): Payer: Self-pay | Admitting: Neurology

## 2012-12-04 ENCOUNTER — Emergency Department (HOSPITAL_COMMUNITY)
Admission: EM | Admit: 2012-12-04 | Discharge: 2012-12-04 | Disposition: A | Discharge status: Home / Self Care | Payer: BC Managed Care – PPO | Attending: Emergency Medicine | Admitting: Emergency Medicine

## 2012-12-04 DIAGNOSIS — D571 Sickle-cell disease without crisis: Secondary | ICD-10-CM | POA: Insufficient documentation

## 2012-12-04 DIAGNOSIS — F17200 Nicotine dependence, unspecified, uncomplicated: Secondary | ICD-10-CM | POA: Insufficient documentation

## 2012-12-04 DIAGNOSIS — J189 Pneumonia, unspecified organism: Secondary | ICD-10-CM

## 2012-12-04 DIAGNOSIS — R1110 Vomiting, unspecified: Secondary | ICD-10-CM

## 2012-12-04 DIAGNOSIS — R197 Diarrhea, unspecified: Secondary | ICD-10-CM | POA: Insufficient documentation

## 2012-12-04 DIAGNOSIS — R0602 Shortness of breath: Secondary | ICD-10-CM | POA: Insufficient documentation

## 2012-12-04 DIAGNOSIS — J029 Acute pharyngitis, unspecified: Secondary | ICD-10-CM | POA: Insufficient documentation

## 2012-12-04 DIAGNOSIS — J3489 Other specified disorders of nose and nasal sinuses: Secondary | ICD-10-CM | POA: Insufficient documentation

## 2012-12-04 DIAGNOSIS — R5381 Other malaise: Secondary | ICD-10-CM | POA: Insufficient documentation

## 2012-12-04 DIAGNOSIS — E079 Disorder of thyroid, unspecified: Secondary | ICD-10-CM | POA: Insufficient documentation

## 2012-12-04 DIAGNOSIS — Z79899 Other long term (current) drug therapy: Secondary | ICD-10-CM | POA: Insufficient documentation

## 2012-12-04 DIAGNOSIS — R05 Cough: Secondary | ICD-10-CM | POA: Insufficient documentation

## 2012-12-04 DIAGNOSIS — R059 Cough, unspecified: Secondary | ICD-10-CM | POA: Insufficient documentation

## 2012-12-04 DIAGNOSIS — R6883 Chills (without fever): Secondary | ICD-10-CM | POA: Insufficient documentation

## 2012-12-04 LAB — BASIC METABOLIC PANEL
BUN: 9 mg/dL (ref 6–23)
CO2: 23 mEq/L (ref 19–32)
Calcium: 9.5 mg/dL (ref 8.4–10.5)
Chloride: 99 mEq/L (ref 96–112)
Creatinine, Ser: 0.71 mg/dL (ref 0.50–1.10)
GFR calc Af Amer: >90 mL/min (ref >90)
GFR calc non Af Amer: >90 mL/min (ref >90)
Glucose, Bld: 86 mg/dL (ref 70–99)
Potassium: 3.5 mEq/L (ref 3.5–5.1)
Sodium: 136 mEq/L (ref 135–145)

## 2012-12-04 LAB — CBC WITH DIFFERENTIAL/PLATELET
Basophils Absolute: 0.1 10*3/uL (ref 0.0–0.1)
Basophils Relative: 1 % (ref 0–1)
Eosinophils Absolute: 0.3 10*3/uL (ref 0.0–0.7)
Eosinophils Relative: 4 % (ref 0–5)
HCT: 37.6 % (ref 36.0–46.0)
Hemoglobin: 12.9 g/dL (ref 12.0–15.0)
Lymphocytes Relative: 11 % — ABNORMAL LOW (ref 12–46)
Lymphs Abs: 0.9 10*3/uL (ref 0.7–4.0)
MCH: 28.3 pg (ref 26.0–34.0)
MCHC: 34.3 g/dL (ref 30.0–36.0)
MCV: 82.5 fL (ref 78.0–100.0)
Monocytes Absolute: 0.7 10*3/uL (ref 0.1–1.0)
Monocytes Relative: 8 % (ref 3–12)
Neutro Abs: 6.6 10*3/uL (ref 1.7–7.7)
Neutrophils Relative %: 77 % (ref 43–77)
Platelets: 369 10*3/uL (ref 150–400)
RBC: 4.56 MIL/uL (ref 3.87–5.11)
RDW: 12.8 % (ref 11.5–15.5)
WBC: 8.5 10*3/uL (ref 4.0–10.5)

## 2012-12-04 LAB — RETICULOCYTES
RBC.: 4.56 MIL/uL (ref 3.87–5.11)
Retic Count, Absolute: 59.3 10*3/uL (ref 19.0–186.0)
Retic Ct Pct: 1.3 % (ref 0.4–3.1)

## 2012-12-04 MED ORDER — FENTANYL CITRATE 0.05 MG/ML IJ SOLN
50.0000 ug | Freq: Once | INTRAMUSCULAR | Status: AC
  Administered 2012-12-04: 50 ug via INTRAVENOUS
  Filled 2012-12-04: qty 2

## 2012-12-04 MED ORDER — AZITHROMYCIN 250 MG PO TABS
500.0000 mg | ORAL_TABLET | Freq: Once | ORAL | Status: AC
  Administered 2012-12-04: 500 mg via ORAL
  Filled 2012-12-04: qty 2

## 2012-12-04 MED ORDER — ACETAMINOPHEN 325 MG PO TABS
650.0000 mg | ORAL_TABLET | Freq: Once | ORAL | Status: AC
  Administered 2012-12-04: 650 mg via ORAL
  Filled 2012-12-04: qty 2

## 2012-12-04 MED ORDER — AZITHROMYCIN 250 MG PO TABS: 250.0000 mg | ORAL_TABLET | Freq: Every day | ORAL | Status: DC

## 2012-12-04 MED ORDER — DEXTROSE 5 % IV SOLN
1.0000 g | Freq: Once | INTRAVENOUS | Status: AC
  Administered 2012-12-04: 1 g via INTRAVENOUS
  Filled 2012-12-04: qty 10

## 2012-12-04 MED ORDER — SODIUM CHLORIDE 0.9 % IV SOLN
Freq: Once | INTRAVENOUS | Status: AC
  Administered 2012-12-04: 12:00:00 via INTRAVENOUS

## 2012-12-04 MED ORDER — SODIUM CHLORIDE 0.9 % IV BOLUS (SEPSIS)
1000.0000 mL | Freq: Once | INTRAVENOUS | Status: AC
  Administered 2012-12-04: 1000 mL via INTRAVENOUS

## 2012-12-04 MED ORDER — ONDANSETRON HCL 4 MG PO TABS: 4.0000 mg | ORAL_TABLET | Freq: Four times a day (QID) | ORAL | Status: DC

## 2012-12-04 MED ORDER — ONDANSETRON HCL 4 MG/2ML IJ SOLN
4.0000 mg | Freq: Once | INTRAMUSCULAR | Status: AC
  Administered 2012-12-04: 4 mg via INTRAVENOUS
  Filled 2012-12-04: qty 2

## 2012-12-04 NOTE — ED Provider Notes (Signed)
Physical Exam  BP 109/72  Pulse 97  Temp 99.7 F (37.6 C) (Oral)  Resp 17  SpO2 100%  Physical Exam On examination she appeared in good health and spirits. Vital signs as documented. Skin warm and dry and without overt rashes. Neck without JVD. Lungs clear. Heart exam notable for regular rhythm, normal sounds and absence of murmurs, rubs or gallops. Abdomen with mild epigastric tenderness without guarding or rebound tenderness. Extremities nonedematous.  ED Course  Procedures  MDM Pt sent to CDU for further management of her pneumonia.  Pt was evaluated by Dr. Bebe Shaggy.    Pt reports hx of sickle cell disease.  No documentation correlating her sickle cell hx from prior charts via Epic.  CXR shows signs concerning for PNA, likely community acquired.  Dr. Bebe Shaggy does not think pt has acute chest syndrome.  Her labs are unremarkable.  Plan for IVF, fluid, and will d/c with zithromax once pt finished with her abx and feel better.     1:04 PM Pt has received her antibiotic.  Does not have an appetite.  Will continue IVF and will po challenge prior to discharge.  Otherwise pt appears nontoxic, VSS.    2:24 PM Pt felt better, able to tolerates PO.  Will d/c with Zithromax.    BP 116/76  Pulse 108  Temp 99.6 F (37.6 C) (Oral)  Resp 23  SpO2 99%  I have reviewed nursing notes and vital signs. I personally reviewed the imaging tests through PACS system  I reviewed available ER/hospitalization records thought the EMR  Results for orders placed during the hospital encounter of 12/04/12  CBC WITH DIFFERENTIAL      Component Value Range   WBC 8.5  4.0 - 10.5 K/uL   RBC 4.56  3.87 - 5.11 MIL/uL   Hemoglobin 12.9  12.0 - 15.0 g/dL   HCT 11.9  14.7 - 82.9 %   MCV 82.5  78.0 - 100.0 fL   MCH 28.3  26.0 - 34.0 pg   MCHC 34.3  30.0 - 36.0 g/dL   RDW 56.2  13.0 - 86.5 %   Platelets 369  150 - 400 K/uL   Neutrophils Relative 77  43 - 77 %   Neutro Abs 6.6  1.7 - 7.7 K/uL   Lymphocytes Relative 11 (*) 12 - 46 %   Lymphs Abs 0.9  0.7 - 4.0 K/uL   Monocytes Relative 8  3 - 12 %   Monocytes Absolute 0.7  0.1 - 1.0 K/uL   Eosinophils Relative 4  0 - 5 %   Eosinophils Absolute 0.3  0.0 - 0.7 K/uL   Basophils Relative 1  0 - 1 %   Basophils Absolute 0.1  0.0 - 0.1 K/uL  BASIC METABOLIC PANEL      Component Value Range   Sodium 136  135 - 145 mEq/L   Potassium 3.5  3.5 - 5.1 mEq/L   Chloride 99  96 - 112 mEq/L   CO2 23  19 - 32 mEq/L   Glucose, Bld 86  70 - 99 mg/dL   BUN 9  6 - 23 mg/dL   Creatinine, Ser 7.84  0.50 - 1.10 mg/dL   Calcium 9.5  8.4 - 69.6 mg/dL   GFR calc non Af Amer >90  >90 mL/min   GFR calc Af Amer >90  >90 mL/min  RETICULOCYTES      Component Value Range   Retic Ct Pct 1.3  0.4 - 3.1 %  RBC. 4.56  3.87 - 5.11 MIL/uL   Retic Count, Manual 59.3  19.0 - 186.0 K/uL   Dg Chest 2 View  12/04/2012  *RADIOLOGY REPORT*  Clinical Data: Chest discomfort.  Hypertension.  CHEST - 2 VIEW  Comparison: 03/14/2012  Findings: Streaky opacity is noted in the lingula which is new since previous exam, and is suspicious for atelectasis or bronchopneumonia.  Right lung is clear.  No evidence of pleural effusion.  Heart size and mediastinal contours are normal.  IMPRESSION: Mild streaky opacity in the lingula, suspicious for atelectasis or bronchopneumonia. Post-treatment  radiographic followup recommended to confirm resolution.   Original Report Authenticated By: Myles Rosenthal, M.D.       Fayrene Helper, PA-C 12/04/12 1425

## 2012-12-04 NOTE — ED Provider Notes (Signed)
Medical screening examination/treatment/procedure(s) were conducted as a shared visit with non-physician practitioner(s) and myself.  I personally evaluated the patient during the encounter   Joya Gaskins, MD 12/04/12 [phone]

## 2012-12-04 NOTE — ED Notes (Signed)
Called pharmacy to notify need for antibiotic dose adjustment
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
## 2012-12-04 NOTE — ED Notes (Signed)
PA at bedside.

## 2012-12-04 NOTE — ED Notes (Signed)
Spoke with Drue, Pharmacist. Reporting antibiotic dosing is appropriate.

## 2012-12-04 NOTE — ED Notes (Signed)
Pt reporting Thursday n/v, fever, sore throat, non-productive cough. 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 Last night at 11 pm, while working developed CP. Left sided radiation to back. Pt is a x 4. C/o "achy" all over.

## 2013-01-21 ENCOUNTER — Emergency Department (HOSPITAL_COMMUNITY): Payer: BC Managed Care – PPO

## 2013-01-21 ENCOUNTER — Emergency Department (HOSPITAL_COMMUNITY)
Admission: EM | Admit: 2013-01-21 | Discharge: 2013-01-21 | Disposition: A | Discharge status: Home / Self Care | Payer: BC Managed Care – PPO | Attending: Emergency Medicine | Admitting: Emergency Medicine

## 2013-01-21 ENCOUNTER — Other Ambulatory Visit: Payer: Self-pay

## 2013-01-21 ENCOUNTER — Encounter (HOSPITAL_COMMUNITY): Payer: Self-pay | Admitting: Adult Health

## 2013-01-21 DIAGNOSIS — M545 Low back pain, unspecified: Secondary | ICD-10-CM | POA: Insufficient documentation

## 2013-01-21 DIAGNOSIS — R079 Chest pain, unspecified: Secondary | ICD-10-CM

## 2013-01-21 DIAGNOSIS — E079 Disorder of thyroid, unspecified: Secondary | ICD-10-CM | POA: Insufficient documentation

## 2013-01-21 DIAGNOSIS — R0602 Shortness of breath: Secondary | ICD-10-CM | POA: Insufficient documentation

## 2013-01-21 DIAGNOSIS — R55 Syncope and collapse: Secondary | ICD-10-CM | POA: Insufficient documentation

## 2013-01-21 DIAGNOSIS — R110 Nausea: Secondary | ICD-10-CM | POA: Insufficient documentation

## 2013-01-21 DIAGNOSIS — R61 Generalized hyperhidrosis: Secondary | ICD-10-CM | POA: Insufficient documentation

## 2013-01-21 DIAGNOSIS — F17200 Nicotine dependence, unspecified, uncomplicated: Secondary | ICD-10-CM | POA: Insufficient documentation

## 2013-01-21 DIAGNOSIS — R51 Headache: Secondary | ICD-10-CM | POA: Insufficient documentation

## 2013-01-21 DIAGNOSIS — Z862 Personal history of diseases of the blood and blood-forming organs and certain disorders involving the immune mechanism: Secondary | ICD-10-CM | POA: Insufficient documentation

## 2013-01-21 LAB — BASIC METABOLIC PANEL
BUN: 8 mg/dL (ref 6–23)
CO2: 23 mEq/L (ref 19–32)
Calcium: 9.1 mg/dL (ref 8.4–10.5)
Chloride: 101 mEq/L (ref 96–112)
Creatinine, Ser: 0.85 mg/dL (ref 0.50–1.10)
GFR calc Af Amer: >90 mL/min (ref >90)
GFR calc non Af Amer: 82 mL/min — ABNORMAL LOW (ref >90)
Glucose, Bld: 93 mg/dL (ref 70–99)
Potassium: 3.5 mEq/L (ref 3.5–5.1)
Sodium: 135 mEq/L (ref 135–145)

## 2013-01-21 LAB — CBC
HCT: 35.8 % — ABNORMAL LOW (ref 36.0–46.0)
Hemoglobin: 12.4 g/dL (ref 12.0–15.0)
MCH: 28.9 pg (ref 26.0–34.0)
MCHC: 34.6 g/dL (ref 30.0–36.0)
MCV: 83.4 fL (ref 78.0–100.0)
Platelets: 388 10*3/uL (ref 150–400)
RBC: 4.29 MIL/uL (ref 3.87–5.11)
RDW: 13.6 % (ref 11.5–15.5)
WBC: 8.2 10*3/uL (ref 4.0–10.5)

## 2013-01-21 LAB — HEPATIC FUNCTION PANEL
ALT: 17 U/L (ref 0–35)
AST: 20 U/L (ref 0–37)
Albumin: 3.9 g/dL (ref 3.5–5.2)
Alkaline Phosphatase: 53 U/L (ref 39–117)
Bilirubin, Direct: <0.1 mg/dL (ref 0.0–0.3)
Total Bilirubin: 0.2 mg/dL — ABNORMAL LOW (ref 0.3–1.2)
Total Protein: 7.3 g/dL (ref 6.0–8.3)

## 2013-01-21 LAB — PROTIME-INR
INR: 0.9 (ref 0.00–1.49)
Prothrombin Time: 12.1 seconds (ref 11.6–15.2)

## 2013-01-21 LAB — POCT I-STAT TROPONIN I: Troponin i, poc: 0 ng/mL (ref 0.00–0.08)

## 2013-01-21 LAB — D-DIMER, QUANTITATIVE: D-Dimer, Quant: 0.4 ug/mL-FEU (ref 0.00–0.48)

## 2013-01-21 LAB — APTT: aPTT: 33 seconds (ref 24–37)

## 2013-01-21 LAB — LIPASE, BLOOD: Lipase: 31 U/L (ref 11–59)

## 2013-01-21 MED ORDER — DIPHENHYDRAMINE HCL 50 MG/ML IJ SOLN
25.0000 mg | Freq: Once | INTRAMUSCULAR | Status: AC
  Administered 2013-01-21: 25 mg via INTRAVENOUS
  Filled 2013-01-21: qty 1

## 2013-01-21 MED ORDER — IOHEXOL 350 MG/ML SOLN
100.0000 mL | Freq: Once | INTRAVENOUS | Status: AC | PRN
  Administered 2013-01-21: 100 mL via INTRAVENOUS

## 2013-01-21 MED ORDER — MORPHINE SULFATE 4 MG/ML IJ SOLN
4.0000 mg | Freq: Once | INTRAMUSCULAR | Status: AC
  Administered 2013-01-21: 4 mg via INTRAVENOUS
  Filled 2013-01-21: qty 1

## 2013-01-21 MED ORDER — METHOCARBAMOL 500 MG PO TABS: ORAL_TABLET | ORAL | Status: DC

## 2013-01-21 MED ORDER — METOCLOPRAMIDE HCL 5 MG/ML IJ SOLN
10.0000 mg | Freq: Once | INTRAMUSCULAR | Status: AC
  Administered 2013-01-21: 10 mg via INTRAVENOUS
  Filled 2013-01-21: qty 2
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
## 2013-01-21 NOTE — ED Notes (Signed)
Patient transported to CT 

## 2013-01-21 NOTE — ED Notes (Addendum)
Presents with pain inbetween shoulders blades and chest pain described as squeezing associated with nausea and diaphoresis, SOB, and high blood pressure. Pain is constant. Nothing makes pain better. Both arms have been feeling numb.  HX of sickle cell.

## 2013-06-12 ENCOUNTER — Encounter (HOSPITAL_COMMUNITY): Payer: Self-pay | Admitting: Emergency Medicine

## 2013-06-12 ENCOUNTER — Emergency Department (HOSPITAL_COMMUNITY)
Admission: EM | Admit: 2013-06-12 | Discharge: 2013-06-13 | Disposition: A | Discharge status: Home / Self Care | Payer: BC Managed Care – PPO | Attending: Emergency Medicine | Admitting: Emergency Medicine

## 2013-06-12 DIAGNOSIS — Z79899 Other long term (current) drug therapy: Secondary | ICD-10-CM | POA: Insufficient documentation

## 2013-06-12 DIAGNOSIS — D571 Sickle-cell disease without crisis: Secondary | ICD-10-CM | POA: Insufficient documentation

## 2013-06-12 DIAGNOSIS — F17200 Nicotine dependence, unspecified, uncomplicated: Secondary | ICD-10-CM | POA: Insufficient documentation

## 2013-06-12 DIAGNOSIS — E079 Disorder of thyroid, unspecified: Secondary | ICD-10-CM | POA: Insufficient documentation

## 2013-06-12 DIAGNOSIS — J029 Acute pharyngitis, unspecified: Secondary | ICD-10-CM | POA: Insufficient documentation

## 2013-06-12 LAB — RAPID STREP SCREEN (MED CTR MEBANE ONLY): Streptococcus, Group A Screen (Direct): NEGATIVE

## 2013-06-12 NOTE — ED Notes (Signed)
PT. REPORTS SORE THROAT , " HARD TO SWALLOW " OCCASIONAL PRODUCTIVE COUGH ONSET YESTERDAY , DENIES FEVER OR CHILLS.

## 2013-06-13 MED ORDER — OXYCODONE HCL 5 MG/5ML PO SOLN
10.0000 mg | ORAL | Status: DC | PRN
  Administered 2013-06-13: 10 mg via ORAL
  Filled 2013-06-13: qty 10

## 2013-06-13 MED ORDER — OXYCODONE HCL 5 MG/5ML PO SOLN: 10.0000 mg | ORAL | Status: DC | PRN

## 2013-06-13 MED ORDER — DEXAMETHASONE 1 MG/ML PO CONC
10.0000 mg | Freq: Once | ORAL | Status: AC
  Administered 2013-06-13: 10 mg via ORAL
  Filled 2013-06-13: qty 10
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
## 2013-06-13 NOTE — ED Provider Notes (Signed)
Medical screening examination/treatment/procedure(s) were performed by non-physician practitioner and as supervising physician I was immediately available for consultation/collaboration.   Loletta Harper, MD 06/13/13 0607 

## 2013-06-13 NOTE — ED Provider Notes (Signed)
Medical screening examination/treatment/procedure(s) were performed by non-physician practitioner and as supervising physician I was immediately available for consultation/collaboration.   Zen Felling, MD 06/13/13 0606 

## 2013-06-14 LAB — CULTURE, GROUP A STREP

## 2013-09-27 ENCOUNTER — Ambulatory Visit: Payer: Self-pay | Admitting: Internal Medicine

## 2013-10-07 ENCOUNTER — Encounter (HOSPITAL_COMMUNITY): Payer: Self-pay | Admitting: Emergency Medicine

## 2013-10-07 ENCOUNTER — Emergency Department (HOSPITAL_COMMUNITY)
Admission: EM | Admit: 2013-10-07 | Discharge: 2013-10-07 | Disposition: A | Discharge status: Home / Self Care | Payer: BC Managed Care – PPO | Attending: Emergency Medicine | Admitting: Emergency Medicine

## 2013-10-07 DIAGNOSIS — IMO0002 Reserved for concepts with insufficient information to code with codable children: Secondary | ICD-10-CM | POA: Insufficient documentation

## 2013-10-07 DIAGNOSIS — E079 Disorder of thyroid, unspecified: Secondary | ICD-10-CM | POA: Insufficient documentation

## 2013-10-07 DIAGNOSIS — Z79899 Other long term (current) drug therapy: Secondary | ICD-10-CM | POA: Insufficient documentation

## 2013-10-07 DIAGNOSIS — Z862 Personal history of diseases of the blood and blood-forming organs and certain disorders involving the immune mechanism: Secondary | ICD-10-CM | POA: Insufficient documentation

## 2013-10-07 DIAGNOSIS — L0390 Cellulitis, unspecified: Secondary | ICD-10-CM

## 2013-10-07 DIAGNOSIS — IMO0001 Reserved for inherently not codable concepts without codable children: Secondary | ICD-10-CM | POA: Insufficient documentation

## 2013-10-07 DIAGNOSIS — B029 Zoster without complications: Secondary | ICD-10-CM | POA: Insufficient documentation

## 2013-10-07 DIAGNOSIS — F17200 Nicotine dependence, unspecified, uncomplicated: Secondary | ICD-10-CM | POA: Insufficient documentation

## 2013-10-07 MED ORDER — ACYCLOVIR 400 MG PO TABS: 800.0000 mg | ORAL_TABLET | Freq: Every day | ORAL | Status: DC

## 2013-10-07 MED ORDER — CLINDAMYCIN HCL 150 MG PO CAPS: 450.0000 mg | ORAL_CAPSULE | Freq: Three times a day (TID) | ORAL | Status: DC

## 2013-10-07 MED ORDER — HYDROCODONE-ACETAMINOPHEN 5-325 MG PO TABS: 1.0000 | ORAL_TABLET | Freq: Four times a day (QID) | ORAL | Status: DC | PRN

## 2013-10-07 MED ORDER — HYDROCODONE-ACETAMINOPHEN 5-325 MG PO TABS
2.0000 | ORAL_TABLET | Freq: Once | ORAL | Status: AC
  Administered 2013-10-07: 2 via ORAL
  Filled 2013-10-07: qty 2
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
## 2013-10-07 NOTE — ED Provider Notes (Signed)
  Medical screening examination/treatment/procedure(s) were performed by non-physician practitioner and as supervising physician I was immediately available for consultation/collaboration.    Airi Copado, MD 10/07/13 2335 

## 2013-10-07 NOTE — ED Notes (Signed)
Pt states that she was driving back from [place] on Thursday and on Friday she noticed that she has two bug bites on her right arm near her elbow. Pt states that they have stopped itching and now her muscle is hurting in her right arm. Pt states she also has one spot on her chest that is raised and looks similar to the two bug bites on her right arm.

## 2013-10-29 ENCOUNTER — Encounter (HOSPITAL_COMMUNITY): Payer: Self-pay | Admitting: Emergency Medicine

## 2013-10-29 ENCOUNTER — Emergency Department (HOSPITAL_COMMUNITY)
Admission: EM | Admit: 2013-10-29 | Discharge: 2013-10-30 | Disposition: A | Discharge status: Home / Self Care | Payer: BC Managed Care – PPO | Attending: Emergency Medicine | Admitting: Emergency Medicine

## 2013-10-29 ENCOUNTER — Emergency Department (HOSPITAL_COMMUNITY): Payer: BC Managed Care – PPO

## 2013-10-29 DIAGNOSIS — E7800 Pure hypercholesterolemia, unspecified: Secondary | ICD-10-CM | POA: Insufficient documentation

## 2013-10-29 DIAGNOSIS — R51 Headache: Secondary | ICD-10-CM | POA: Insufficient documentation

## 2013-10-29 DIAGNOSIS — R519 Headache, unspecified: Secondary | ICD-10-CM

## 2013-10-29 DIAGNOSIS — R002 Palpitations: Secondary | ICD-10-CM | POA: Insufficient documentation

## 2013-10-29 DIAGNOSIS — Z79899 Other long term (current) drug therapy: Secondary | ICD-10-CM | POA: Insufficient documentation

## 2013-10-29 DIAGNOSIS — E039 Hypothyroidism, unspecified: Secondary | ICD-10-CM | POA: Insufficient documentation

## 2013-10-29 DIAGNOSIS — Z862 Personal history of diseases of the blood and blood-forming organs and certain disorders involving the immune mechanism: Secondary | ICD-10-CM | POA: Insufficient documentation

## 2013-10-29 DIAGNOSIS — F17200 Nicotine dependence, unspecified, uncomplicated: Secondary | ICD-10-CM | POA: Insufficient documentation

## 2013-10-29 LAB — CBC
HCT: 35.8 % — ABNORMAL LOW (ref 36.0–46.0)
Hemoglobin: 12.5 g/dL (ref 12.0–15.0)
MCH: 28.8 pg (ref 26.0–34.0)
MCHC: 34.9 g/dL (ref 30.0–36.0)
MCV: 82.5 fL (ref 78.0–100.0)
Platelets: 488 10*3/uL — ABNORMAL HIGH (ref 150–400)
RBC: 4.34 MIL/uL (ref 3.87–5.11)
RDW: 13.3 % (ref 11.5–15.5)
WBC: 6.9 10*3/uL (ref 4.0–10.5)

## 2013-10-29 LAB — BASIC METABOLIC PANEL
BUN: 11 mg/dL (ref 6–23)
CO2: 26 mEq/L (ref 19–32)
Calcium: 9.6 mg/dL (ref 8.4–10.5)
Chloride: 103 mEq/L (ref 96–112)
Creatinine, Ser: 0.83 mg/dL (ref 0.50–1.10)
GFR calc Af Amer: >90 mL/min (ref >90)
GFR calc non Af Amer: 84 mL/min — ABNORMAL LOW (ref >90)
Glucose, Bld: 100 mg/dL — ABNORMAL HIGH (ref 70–99)
Potassium: 3.7 mEq/L (ref 3.5–5.1)
Sodium: 138 mEq/L (ref 135–145)

## 2013-10-29 LAB — POCT I-STAT TROPONIN I: Troponin i, poc: 0 ng/mL (ref 0.00–0.08)

## 2013-10-29 LAB — PRO B NATRIURETIC PEPTIDE: Pro B Natriuretic peptide (BNP): 14.2 pg/mL (ref 0–125)

## 2013-10-29 NOTE — ED Notes (Signed)
Pt. reports mid chest pressure with nausea , dizziness , palpitations and SOB onset this evening .
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
## 2014-02-02 IMAGING — CR DG CHEST 2V
2 series · 2 of 2 positions shown · non-contrast
Comparison: 03/14/2012

CLINICAL DATA: Chest discomfort.  Hypertension.

CHEST - 2 VIEW

[w chest pa]
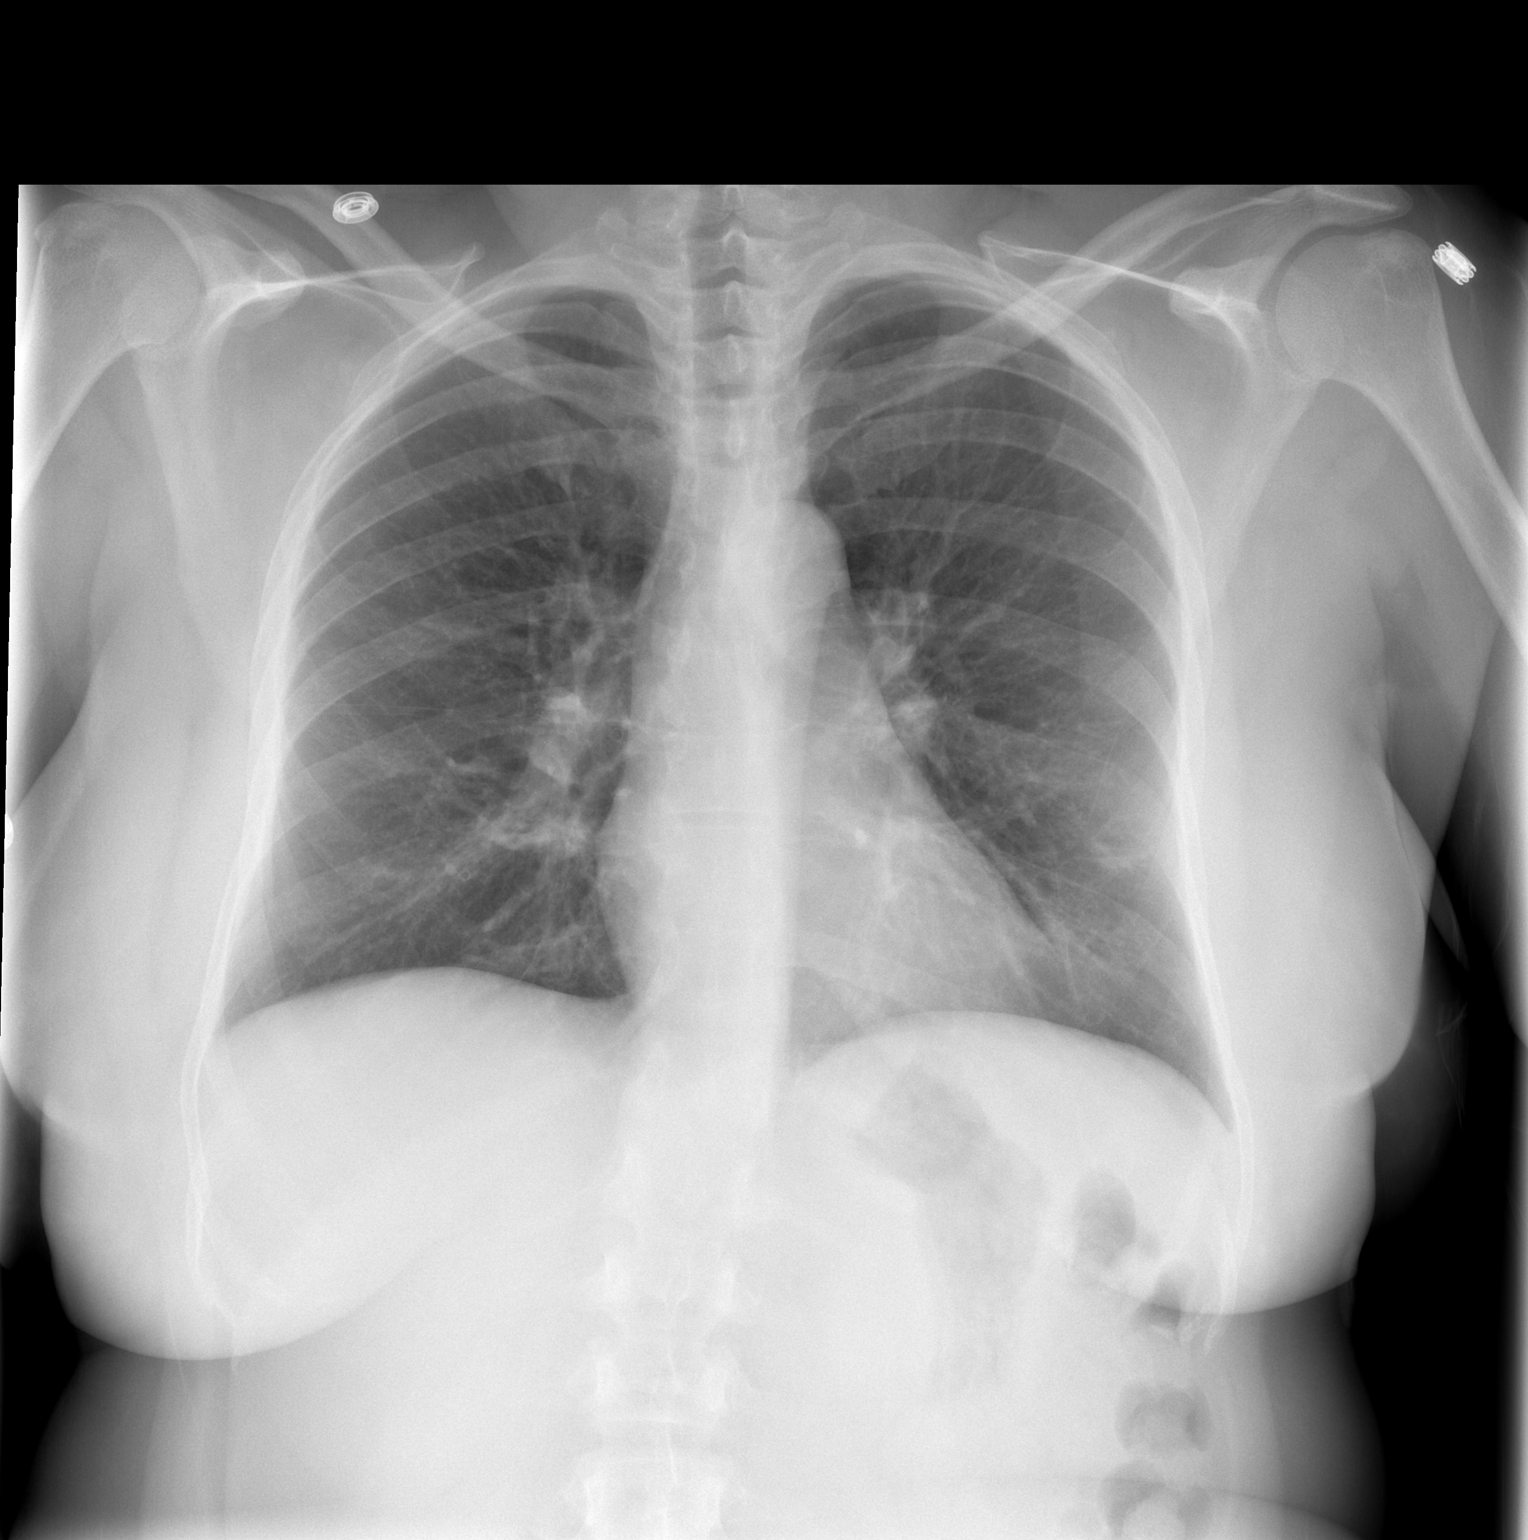

[w chest lat]
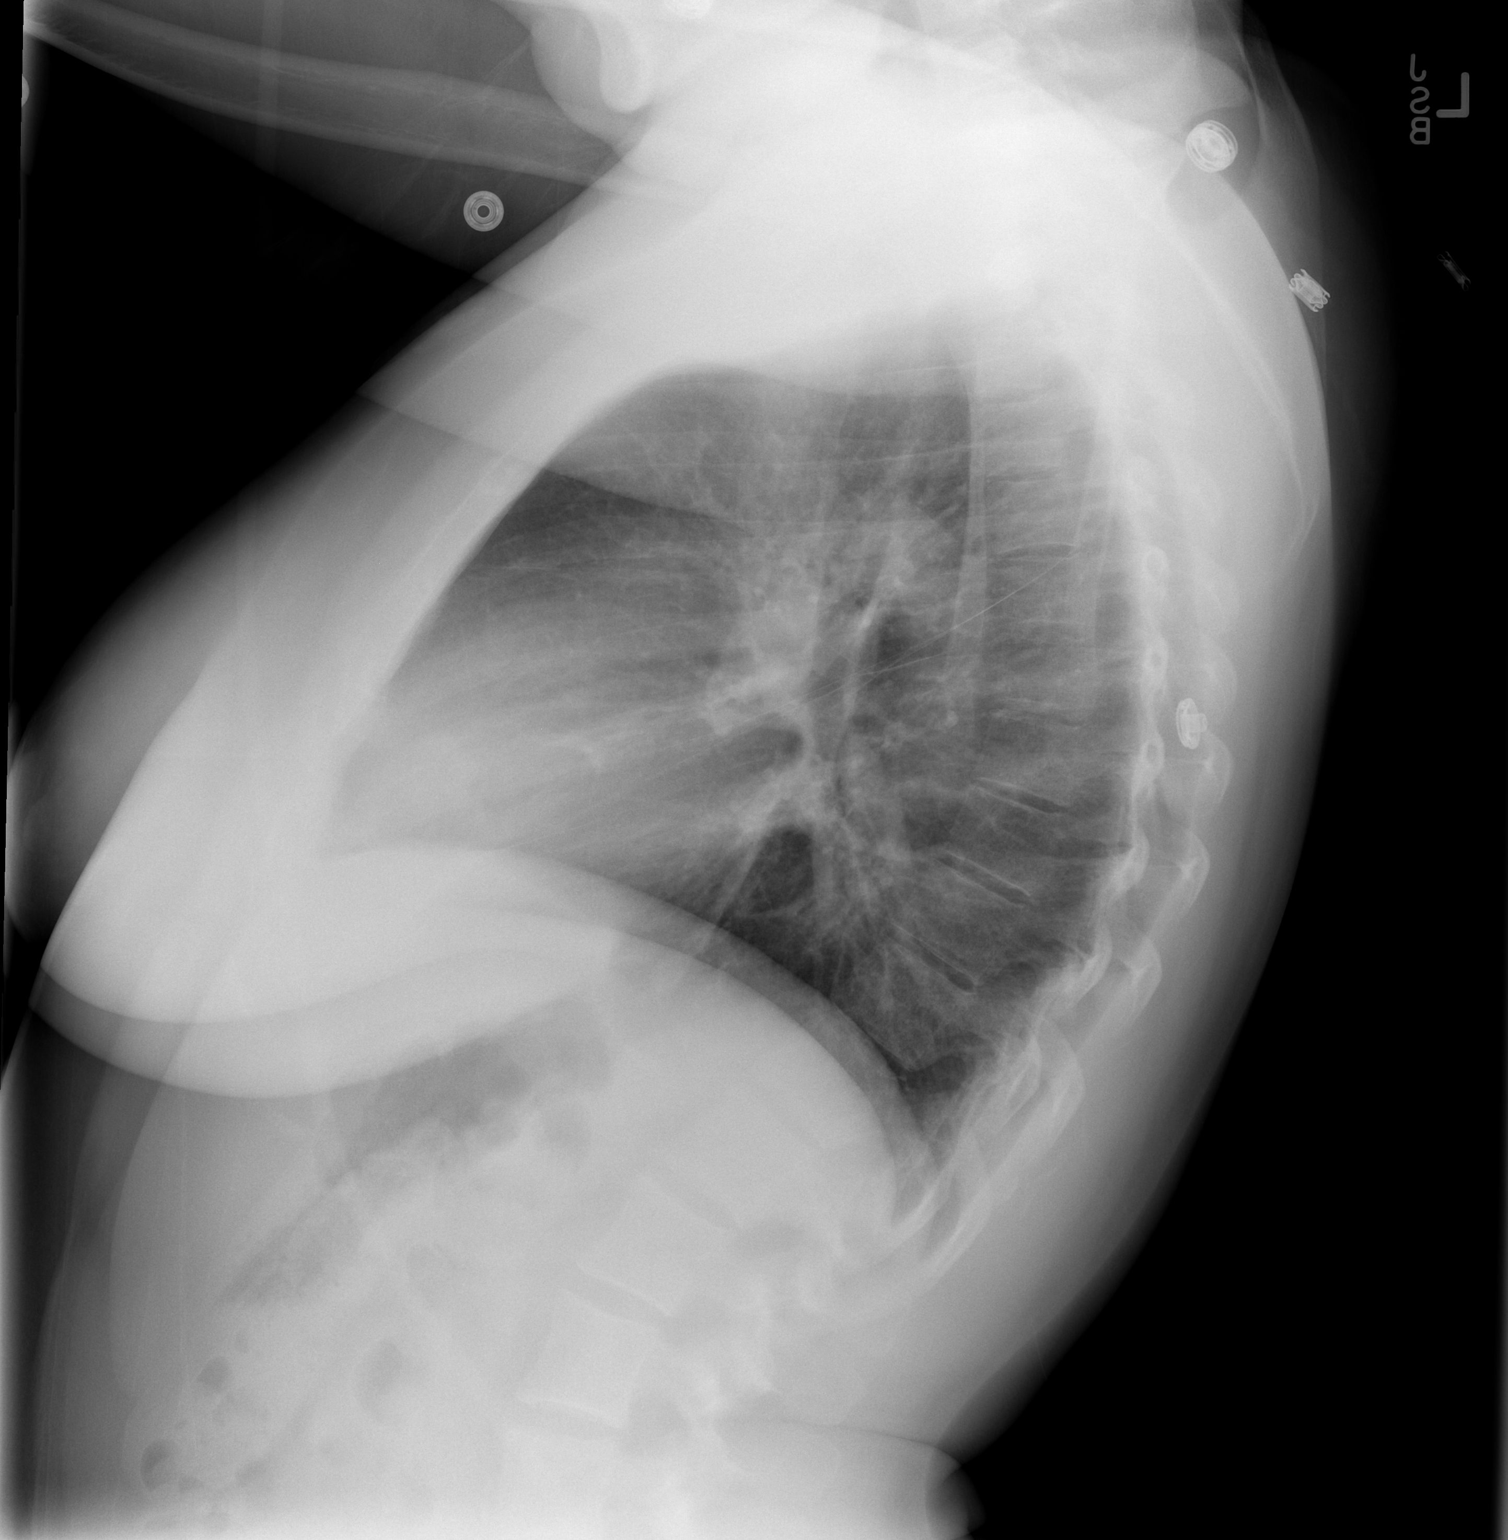

[2 of 2 positions shown; findings below may reference images not displayed]

FINDINGS: Streaky opacity is noted in the lingula which is new
since previous exam, and is suspicious for atelectasis or
bronchopneumonia.  Right lung is clear.  No evidence of pleural
effusion.  Heart size and mediastinal contours are normal.
IMPRESSION: Mild streaky opacity in the lingula, suspicious for atelectasis or
bronchopneumonia. Post-treatment  radiographic followup recommended
to confirm resolution.

## 2014-11-21 ENCOUNTER — Emergency Department (HOSPITAL_COMMUNITY): Payer: 59

## 2014-11-21 ENCOUNTER — Encounter (HOSPITAL_COMMUNITY): Payer: Self-pay | Admitting: Emergency Medicine

## 2014-11-21 ENCOUNTER — Emergency Department (HOSPITAL_COMMUNITY)
Admission: EM | Admit: 2014-11-21 | Discharge: 2014-11-21 | Disposition: A | Discharge status: Home / Self Care | Payer: 59 | Attending: Emergency Medicine | Admitting: Emergency Medicine

## 2014-11-21 DIAGNOSIS — M79672 Pain in left foot: Secondary | ICD-10-CM | POA: Diagnosis not present

## 2014-11-21 DIAGNOSIS — Z720 Tobacco use: Secondary | ICD-10-CM | POA: Diagnosis not present

## 2014-11-21 DIAGNOSIS — E079 Disorder of thyroid, unspecified: Secondary | ICD-10-CM | POA: Diagnosis not present

## 2014-11-21 DIAGNOSIS — Z79899 Other long term (current) drug therapy: Secondary | ICD-10-CM | POA: Diagnosis not present

## 2014-11-21 DIAGNOSIS — R52 Pain, unspecified: Secondary | ICD-10-CM

## 2014-11-21 DIAGNOSIS — Z862 Personal history of diseases of the blood and blood-forming organs and certain disorders involving the immune mechanism: Secondary | ICD-10-CM | POA: Insufficient documentation

## 2014-11-21 MED ORDER — IBUPROFEN 600 MG PO TABS: 600.0000 mg | ORAL_TABLET | Freq: Three times a day (TID) | ORAL | Status: DC

## 2014-11-21 MED ORDER — HYDROCODONE-ACETAMINOPHEN 5-325 MG PO TABS: 1.0000 | ORAL_TABLET | Freq: Four times a day (QID) | ORAL | Status: DC | PRN

## 2014-11-21 MED ORDER — HYDROCODONE-ACETAMINOPHEN 5-325 MG PO TABS
1.0000 | ORAL_TABLET | Freq: Once | ORAL | Status: AC
  Administered 2014-11-21: 1 via ORAL
  Filled 2014-11-21: qty 1

## 2014-11-21 NOTE — Discharge Instructions (Signed)
Foot Sprain The muscles and cord like structures which attach muscle to bone (tendons) that surround the feet are made up of units. A foot sprain can occur at the weakest spot in any of these units. This condition is most often caused by injury to or overuse of the foot, as from playing contact sports, or aggravating a previous injury, or from poor conditioning, or obesity. SYMPTOMS  Pain with movement of the foot.  Tenderness and swelling at the injury site.  Loss of strength is present in moderate or severe sprains. THE THREE GRADES OR SEVERITY OF FOOT SPRAIN ARE:  Mild (Grade I): Slightly pulled muscle without tearing of muscle or tendon fibers or loss of strength.  Moderate (Grade II): Tearing of fibers in a muscle, tendon, or at the attachment to bone, with small decrease in strength.  Severe (Grade III): Rupture of the muscle-tendon-bone attachment, with separation of fibers. Severe sprain requires surgical repair. Often repeating (chronic) sprains are caused by overuse. Sudden (acute) sprains are caused by direct injury or over-use. DIAGNOSIS  Diagnosis of this condition is usually by your own observation. If problems continue, a caregiver may be required for further evaluation and treatment. X-rays may be required to make sure there are not breaks in the bones (fractures) present. Continued problems may require physical therapy for treatment. PREVENTION  Use strength and conditioning exercises appropriate for your sport.  Warm up properly prior to working out.  Use athletic shoes that are made for the sport you are participating in.  Allow adequate time for healing. Early return to activities makes repeat injury more likely, and can lead to an unstable arthritic foot that can result in prolonged disability. Mild sprains generally heal in 3 to 10 days, with moderate and severe sprains taking 2 to 10 weeks. Your caregiver can help you determine the proper time required for  healing. HOME CARE INSTRUCTIONS   Apply ice to the injury for 15-20 minutes, 03-04 times per day. Put the ice in a plastic bag and place a towel between the bag of ice and your skin.  An elastic wrap (like an Ace bandage) may be used to keep swelling down.  Keep foot above the level of the heart, or at least raised on a footstool, when swelling and pain are present.  Try to avoid use other than gentle range of motion while the foot is painful. Do not resume use until instructed by your caregiver. Then begin use gradually, not increasing use to the point of pain. If pain does develop, decrease use and continue the above measures, gradually increasing activities that do not cause discomfort, until you gradually achieve normal use.  Use crutches if and as instructed, and for the length of time instructed.  Keep injured foot and ankle wrapped between treatments.  Massage foot and ankle for comfort and to keep swelling down. Massage from the toes up towards the knee.  Only take over-the-counter or prescription medicines for pain, discomfort, or fever as directed by your caregiver. SEEK IMMEDIATE MEDICAL CARE IF:   Your pain and swelling increase, or pain is not controlled with medications.  You have loss of feeling in your foot or your foot turns cold or blue.  You develop new, unexplained symptoms, or an increase of the symptoms that brought you to your caregiver. MAKE SURE YOU:   Understand these instructions.  Will watch your condition.  Will get help right away if you are not doing well or get worse. Document Released:  05/29/2002 Document Revised: 02/29/2012 Document Reviewed: 07/26/2008 ExitCare Patient Information 2015 [place], [place]. This information is not intended to replace advice given to you by your health care provider. Make sure you discuss any questions you have with your health care provider.  Gout Gout is an inflammatory arthritis caused by a buildup of uric acid  crystals in the joints. Uric acid is a chemical that is normally present in the blood. When the level of uric acid in the blood is too high it can form crystals that deposit in your joints and tissues. This causes joint redness, soreness, and swelling (inflammation). Repeat attacks are common. Over time, uric acid crystals can form into masses (tophi) near a joint, destroying bone and causing disfigurement. Gout is treatable and often preventable. CAUSES  The disease begins with elevated levels of uric acid in the blood. Uric acid is produced by your body when it breaks down a naturally found substance called purines. Certain foods you eat, such as meats and fish, contain high amounts of purines. Causes of an elevated uric acid level include:  Being passed down from parent to child (heredity).  Diseases that cause increased uric acid production (such as obesity, psoriasis, and certain cancers).  Excessive alcohol use.  Diet, especially diets rich in meat and seafood.  Medicines, including certain cancer-fighting medicines (chemotherapy), water pills (diuretics), and aspirin.  Chronic kidney disease. The kidneys are no longer able to remove uric acid well.  Problems with metabolism. Conditions strongly associated with gout include:  Obesity.  High blood pressure.  High cholesterol.  Diabetes. Not everyone with elevated uric acid levels gets gout. It is not understood why some people get gout and others do not. Surgery, joint injury, and eating too much of certain foods are some of the factors that can lead to gout attacks. SYMPTOMS   An attack of gout comes on quickly. It causes intense pain with redness, swelling, and warmth in a joint.  Fever can occur.  Often, only one joint is involved. Certain joints are more commonly involved:  Base of the big toe.  Knee.  Ankle.  Wrist.  Finger. Without treatment, an attack usually goes away in a few days to weeks. Between attacks,  you usually will not have symptoms, which is different from many other forms of arthritis. DIAGNOSIS  Your caregiver will suspect gout based on your symptoms and exam. In some cases, tests may be recommended. The tests may include:  Blood tests.  Urine tests.  X-rays.  Joint fluid exam. This exam requires a needle to remove fluid from the joint (arthrocentesis). Using a microscope, gout is confirmed when uric acid crystals are seen in the joint fluid. TREATMENT  There are two phases to gout treatment: treating the sudden onset (acute) attack and preventing attacks (prophylaxis).  Treatment of an Acute Attack.  Medicines are used. These include anti-inflammatory medicines or steroid medicines.  An injection of steroid medicine into the affected joint is sometimes necessary.  The painful joint is rested. Movement can worsen the arthritis.  You may use warm or cold treatments on painful joints, depending which works best for you.  Treatment to Prevent Attacks.  If you suffer from frequent gout attacks, your caregiver may advise preventive medicine. These medicines are started after the acute attack subsides. These medicines either help your kidneys eliminate uric acid from your body or decrease your uric acid production. You may need to stay on these medicines for a very long time.  The early  phase of treatment with preventive medicine can be associated with an increase in acute gout attacks. For this reason, during the first few months of treatment, your caregiver may also advise you to take medicines usually used for acute gout treatment. Be sure you understand your caregiver's directions. Your caregiver may make several adjustments to your medicine dose before these medicines are effective.  Discuss dietary treatment with your caregiver or dietitian. Alcohol and drinks high in sugar and fructose and foods such as meat, poultry, and seafood can increase uric acid levels. Your caregiver  or dietitian can advise you on drinks and foods that should be limited. HOME CARE INSTRUCTIONS   Do not take aspirin to relieve pain. This raises uric acid levels.  Only take over-the-counter or prescription medicines for pain, discomfort, or fever as directed by your caregiver.  Rest the joint as much as possible. When in bed, keep sheets and blankets off painful areas.  Keep the affected joint raised (elevated).  Apply warm or cold treatments to painful joints. Use of warm or cold treatments depends on which works best for you.  Use crutches if the painful joint is in your leg.  Drink enough fluids to keep your urine clear or pale yellow. This helps your body get rid of uric acid. Limit alcohol, sugary drinks, and fructose drinks.  Follow your dietary instructions. Pay careful attention to the amount of protein you eat. Your daily diet should emphasize fruits, vegetables, whole grains, and fat-free or low-fat milk products. Discuss the use of coffee, vitamin C, and cherries with your caregiver or dietitian. These may be helpful in lowering uric acid levels.  Maintain a healthy body weight. SEEK MEDICAL CARE IF:   You develop diarrhea, vomiting, or any side effects from medicines.  You do not feel better in 24 hours, or you are getting worse. SEEK IMMEDIATE MEDICAL CARE IF:   Your joint becomes suddenly more tender, and you have chills or a fever. MAKE SURE YOU:   Understand these instructions.  Will watch your condition.  Will get help right away if you are not doing well or get worse. Document Released: 12/04/2000 Document Revised: 04/23/2014 Document Reviewed: 07/20/2012 [facility] Patient Information 2015 [place], [place]. This information is not intended to replace advice given to you by your health care provider. Make sure you discuss any questions you have with your health care provider.   Emergency Department Resource Guide 1) Find a Doctor and Pay Out of  Pocket Although you won't have to find out who is covered by your insurance plan, it is a good idea to ask around and get recommendations. You will then need to call the office and see if the doctor you have chosen will accept you as a new patient and what types of options they offer for patients who are self-pay. Some doctors offer discounts or will set up payment plans for their patients who do not have insurance, but you will need to ask so you aren't surprised when you get to your appointment.  2) Contact Your Local Health Department Not all health departments have doctors that can see patients for sick visits, but many do, so it is worth a call to see if yours does. If you don't know where your local health department is, you can check in your phone book. The CDC also has a tool to help you locate your state's health department, and many state websites also have listings of all of their local health departments.  3) Find a [facility] If your illness is not likely to be very severe or complicated, you may want to try a walk in clinic. These are popping up all over the country in pharmacies, drugstores, and shopping centers. They're usually staffed by nurse practitioners or physician assistants that have been trained to treat common illnesses and complaints. They're usually fairly quick and inexpensive. However, if you have serious medical issues or chronic medical problems, these are probably not your best option.  No Primary Care Doctor: - Call Health Connect at  [phone] - they can help you locate a primary care doctor that  accepts your insurance, provides certain services, etc. - Physician Referral Service- [phone]  Chronic Pain Problems: Organization         Address  Phone   Notes  [facility]  [phone] Patients need to be referred by their primary care doctor.   Medication Assistance: Organization         Address  Phone   Notes  [facility]  Medication [facility] [address] [place], [place] [address] [phone] --Must be a resident of [facility] -- Must have NO insurance coverage whatsoever (no Medicaid/ Medicare, etc.) -- The pt. MUST have a primary care doctor that directs their care regularly and follows them in the community   [facility]  [phone]   [employer]  [phone]    Agencies that provide inexpensive medical care: Organization         Address  Phone   Notes  [place]  [phone]   [facility]    [phone]   [facility] [place],  [address] [phone]   [place] [address] [place] [phone]   Planned Parenthood    [phone]   [facility]    [phone]   [place] and [place] [address], [place] Phone:  [phone], Fax:  [phone] Hours of Operation:  9 am - 6 pm, M-F.  Also accepts Medicaid/Medicare and self-pay.  [facility] for [address], [address], [place] Phone: [phone], Fax: [phone]. Hours of Operation:  8:30 am - 5:30 pm, M-F.  Also accepts Medicaid and self-pay.  [facility] [place] [address] [place] Phone: [phone]   [place], [place], [place] [phone], Ext. [phone] Mondays & Thursdays: 7-9 AM.  First 15 patients are seen on a first come, first serve basis.    [place] Providers:  Organization         Address  Phone   Notes  [facility] [address], [place] [phone] Also accepts self-pay patients.  [facility] [address], [place]  [phone]   [place], [address], [place] [phone]   [facility] [address] [place] [phone]   Lucianne Lei [address], [place]   [phone] Only accepts [place] Access [place] patients after they have their name applied to their card.   Self-Pay (no insurance) in [facility]:  Organization         Address  Phone   Notes  Sickle Cell Patients, [facility] [phone]  [place] [phone]   [facility] [place] [phone]   [facility] [place]  [place], [place], [place] [phone]   [facility]/Dr. Osei-Bonsu  [address] [place] or [address], [place] [phone] Phone number for both [place] and [place] locations is the same.  [facility]gent [facility] and [facility] [address], [place] [phone]   [facility] [address] [place] or [address] [phone]   [facility] [address] [place] [phone], phone; [phone], fax Sees patients 1st and 3rd Saturday of every month.  Must not qualify for public or private insurance (i.e. Medicaid, Medicare, [place] Health Choice, Veterans' Benefits)  Household income should be no more than 200% of the poverty level The clinic cannot treat you if you are pregnant or think you are pregnant  Sexually transmitted diseases are not treated at the clinic.    Dental Care: Organization         Address  Phone  Notes  [facility] Department of [facility] [phone] Accepts children up to age 37 who are enrolled in [place] or [place]; pregnant women with a Medicaid card; and children who have applied for Medicaid or [place] Health Choice, but were declined, whose parents can pay a reduced fee at time of service.  [facility] Department of [facility]  [address], [place]  [phone] Accepts children up to age 37 who are enrolled in [place] or [place]; pregnant women with a Medicaid card; and children who have applied for Medicaid or [place] Health Choice, but were declined, whose parents can pay a reduced fee at time of service.  [place] Adult Dental Access PROGRAM  [place] [phone] Patients are seen by appointment only. Walk-ins are not accepted. [place] will see patients 35 years of age and older. Monday - Tuesday (8am-5pm) Most Wednesdays (8:30-5pm) $30 per visit, cash only  [facility] Adult Dental Access PROGRAM  [address], [facility] [phone] Patients are seen by appointment only. Walk-ins are not accepted. [place] will see patients 34 years of age and older. One Wednesday Evening (Monthly: Volunteer Based).  $30 per visit, cash only  [place]  [phone] for adults; Children under age 40, call Graduate Pediatric Dentistry at [phone]. Children aged 46-14, please call [phone] to request a pediatric application.  Dental services are provided in all areas of dental care including fillings, crowns and bridges, complete and partial dentures, implants, gum treatment, root canals, and extractions. Preventive care is also provided. Treatment is provided to both adults and children. Patients are selected via a lottery and there is often a waiting list.   [facility] [address], [place]  [phone] [email or web address]   [facility] [address] [place], [place] [phone], Ext. [phone] Second and Fourth Thursday of each month, opens at 6:30 AM; Clinic ends at 9 AM.  Patients are seen on a first-come first-served basis, and a limited number are seen during each clinic.   [facility]  [address] Hillard Danker [place], [place] [phone]   Eligibility Requirements You must have lived in [place], [place],  or [place]  counties for at least the last three months.   You cannot be eligible for state or federal sponsored [employer], including [employer], [place], or [employer].   You generally cannot be eligible for healthcare insurance through your employer.    How to apply: Eligibility screenings are held every Tuesday and Wednesday afternoon from 1:00 pm until 4:00 pm. You do not need an appointment for the interview!  [facility] [address] [place], [place]   [place]  [place] Department  [place]  [phone]    Behavioral Health Resources in the Community: Intensive Outpatient Programs Organization         Address  Phone  Notes  [place] [address], [place], [place] [phone]   [facility] Outpatient [address] [place], [place]   ADS: Alcohol & Drug Svcs [address], [place], [place]   [place] [address]  [place], [place] or [phone]   Substance Abuse Resources Organization         Address  Phone  Notes  Alcohol and Drug Services  [phone]   [place]  [phone]   [facility] [place]   Chinita Pester  [phone]   Residential & Outpatient Substance Abuse Program  [phone]   Psychological Services Organization         Address  Phone  Notes  [facility] [place]  [place]  [phone]   [place] [address] [place] or [phone]    Mobile Crisis Teams Organization         Address  Phone  Notes  [facility], Mobile Crisis Care Unit  [phone]   [facility]  [address] [place], [place]   [place] [address] [place] [phone]    Self-Help/Support Groups Organization         Address  Phone             Notes  [place]. of [place] - variety of support groups  [place] Call for more information  Narcotics Anonymous (NA), [facility] [address], [employer] [place]  2 meetings at this location   Special educational needs teacher         Address  Phone  Notes  [facility] [place],    [place]  [phone]   [facility]  [address] [place] [address]056, [place], [place]   [place], [place] [phone] Admissions: 8am-3pm M-F  Incentives Substance [place] [address]    [place], [place] [phone]   [facility] [address] Jadene Pierini [place], [place]   [facility] [address]  [place], [place]   [facility] Programs - Intensive Outpatient [address] Kristeen [address], [place], [place]   [facility] [place].,  [place], [place] or [phone]   [facility]idential Treatment Services (RTS) [address] [place], [place] Accepts Medicaid  [facility] [place] [address]  [place] 1-7433759326 Substance Abuse/Addiction Treatment   [facility] Resources Organization         Address  Phone  Notes  [facility]  Human Services  [phone]   Domenic Schwab, PhD [address] Arlis Porta [place], [place]   [phone] or [phone]   [place], [place] [phone]   [place] [address], [place], [place] [phone] Insurance/Medicaid/sponsorship through [facility] and Families [address]                                    [place], [place] [phone] [place] [address], [place] [phone]    Dr. Adele Schilder  [phone]   [facility] [place] Dept. 1) [address] [place] 2) [place] 3)  [place] [address], [place] [phone]  [phone]   [place] [phone] or [phone] (After Hours)

## 2014-11-21 NOTE — ED Notes (Signed)
Pt transported to xray 

## 2014-11-21 NOTE — ED Notes (Addendum)
Left foot started hurting yesterday morning, worked 3 (12) hour shifts in a row, finished up on yesterday morning. No known injury. 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 But has received treatments for sickle cell in past.
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
## 2015-02-08 ENCOUNTER — Emergency Department (HOSPITAL_COMMUNITY)
Admission: EM | Admit: 2015-02-08 | Discharge: 2015-02-08 | Disposition: A | Discharge status: Home / Self Care | Payer: 59 | Attending: Emergency Medicine | Admitting: Emergency Medicine

## 2015-02-08 ENCOUNTER — Encounter (HOSPITAL_COMMUNITY): Payer: Self-pay

## 2015-02-08 DIAGNOSIS — Y998 Other external cause status: Secondary | ICD-10-CM | POA: Diagnosis not present

## 2015-02-08 DIAGNOSIS — Y9389 Activity, other specified: Secondary | ICD-10-CM | POA: Diagnosis not present

## 2015-02-08 DIAGNOSIS — S4992XA Unspecified injury of left shoulder and upper arm, initial encounter: Secondary | ICD-10-CM | POA: Diagnosis present

## 2015-02-08 DIAGNOSIS — S46812A Strain of other muscles, fascia and tendons at shoulder and upper arm level, left arm, initial encounter: Secondary | ICD-10-CM

## 2015-02-08 DIAGNOSIS — Z79899 Other long term (current) drug therapy: Secondary | ICD-10-CM | POA: Diagnosis not present

## 2015-02-08 DIAGNOSIS — Z87891 Personal history of nicotine dependence: Secondary | ICD-10-CM | POA: Insufficient documentation

## 2015-02-08 DIAGNOSIS — Z791 Long term (current) use of non-steroidal anti-inflammatories (NSAID): Secondary | ICD-10-CM | POA: Insufficient documentation

## 2015-02-08 DIAGNOSIS — E079 Disorder of thyroid, unspecified: Secondary | ICD-10-CM | POA: Diagnosis not present

## 2015-02-08 DIAGNOSIS — S46912A Strain of unspecified muscle, fascia and tendon at shoulder and upper arm level, left arm, initial encounter: Secondary | ICD-10-CM | POA: Insufficient documentation

## 2015-02-08 DIAGNOSIS — Y92410 Unspecified street and highway as the place of occurrence of the external cause: Secondary | ICD-10-CM | POA: Insufficient documentation

## 2015-02-08 DIAGNOSIS — Z862 Personal history of diseases of the blood and blood-forming organs and certain disorders involving the immune mechanism: Secondary | ICD-10-CM | POA: Diagnosis not present

## 2015-02-08 MED ORDER — CYCLOBENZAPRINE HCL 5 MG PO TABS: 5.0000 mg | ORAL_TABLET | Freq: Three times a day (TID) | ORAL | Status: DC | PRN

## 2015-02-08 MED ORDER — LIDOCAINE 5 % EX PTCH: 1.0000 | MEDICATED_PATCH | CUTANEOUS | Status: DC
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
## 2015-05-05 ENCOUNTER — Emergency Department
Admission: EM | Admit: 2015-05-05 | Discharge: 2015-05-05 | Disposition: A | Discharge status: Home / Self Care | Payer: 59 | Attending: Emergency Medicine | Admitting: Emergency Medicine

## 2015-05-05 ENCOUNTER — Encounter: Payer: Self-pay | Admitting: *Deleted

## 2015-05-05 DIAGNOSIS — Z87891 Personal history of nicotine dependence: Secondary | ICD-10-CM | POA: Insufficient documentation

## 2015-05-05 DIAGNOSIS — Y998 Other external cause status: Secondary | ICD-10-CM | POA: Insufficient documentation

## 2015-05-05 DIAGNOSIS — Y92410 Unspecified street and highway as the place of occurrence of the external cause: Secondary | ICD-10-CM | POA: Insufficient documentation

## 2015-05-05 DIAGNOSIS — Z79899 Other long term (current) drug therapy: Secondary | ICD-10-CM | POA: Diagnosis not present

## 2015-05-05 DIAGNOSIS — S46811A Strain of other muscles, fascia and tendons at shoulder and upper arm level, right arm, initial encounter: Secondary | ICD-10-CM | POA: Insufficient documentation

## 2015-05-05 DIAGNOSIS — S3992XA Unspecified injury of lower back, initial encounter: Secondary | ICD-10-CM | POA: Diagnosis not present

## 2015-05-05 DIAGNOSIS — S4991XA Unspecified injury of right shoulder and upper arm, initial encounter: Secondary | ICD-10-CM | POA: Diagnosis present

## 2015-05-05 DIAGNOSIS — Y9389 Activity, other specified: Secondary | ICD-10-CM | POA: Insufficient documentation

## 2015-05-05 DIAGNOSIS — M62830 Muscle spasm of back: Secondary | ICD-10-CM

## 2015-05-05 MED ORDER — TRAMADOL HCL 50 MG PO TABS: 50.0000 mg | ORAL_TABLET | Freq: Four times a day (QID) | ORAL | Status: DC | PRN

## 2015-05-05 MED ORDER — CYCLOBENZAPRINE HCL 10 MG PO TABS: 10.0000 mg | ORAL_TABLET | Freq: Three times a day (TID) | ORAL | Status: AC | PRN

## 2015-05-05 MED ORDER — KETOROLAC TROMETHAMINE 60 MG/2ML IM SOLN
60.0000 mg | Freq: Once | INTRAMUSCULAR | Status: AC
  Administered 2015-05-05: 60 mg via INTRAMUSCULAR

## 2015-05-05 MED ORDER — KETOROLAC TROMETHAMINE 10 MG PO TABS: 10.0000 mg | ORAL_TABLET | Freq: Four times a day (QID) | ORAL | Status: DC | PRN

## 2015-05-05 MED ORDER — KETOROLAC TROMETHAMINE 60 MG/2ML IM SOLN
INTRAMUSCULAR | Status: AC
  Filled 2015-05-05: qty 2
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
## 2015-12-22 ENCOUNTER — Emergency Department (HOSPITAL_COMMUNITY): Payer: 59

## 2015-12-22 ENCOUNTER — Encounter (HOSPITAL_COMMUNITY): Payer: Self-pay | Admitting: Emergency Medicine

## 2015-12-22 ENCOUNTER — Emergency Department (HOSPITAL_COMMUNITY)
Admission: EM | Admit: 2015-12-22 | Discharge: 2015-12-23 | Disposition: A | Discharge status: Home / Self Care | Payer: 59 | Attending: Emergency Medicine | Admitting: Emergency Medicine

## 2015-12-22 DIAGNOSIS — R202 Paresthesia of skin: Secondary | ICD-10-CM | POA: Insufficient documentation

## 2015-12-22 DIAGNOSIS — M546 Pain in thoracic spine: Secondary | ICD-10-CM | POA: Insufficient documentation

## 2015-12-22 DIAGNOSIS — R200 Anesthesia of skin: Secondary | ICD-10-CM | POA: Insufficient documentation

## 2015-12-22 DIAGNOSIS — Z87891 Personal history of nicotine dependence: Secondary | ICD-10-CM | POA: Diagnosis not present

## 2015-12-22 DIAGNOSIS — R0789 Other chest pain: Secondary | ICD-10-CM | POA: Diagnosis not present

## 2015-12-22 DIAGNOSIS — Z79899 Other long term (current) drug therapy: Secondary | ICD-10-CM | POA: Insufficient documentation

## 2015-12-22 DIAGNOSIS — Z862 Personal history of diseases of the blood and blood-forming organs and certain disorders involving the immune mechanism: Secondary | ICD-10-CM | POA: Diagnosis not present

## 2015-12-22 DIAGNOSIS — N390 Urinary tract infection, site not specified: Secondary | ICD-10-CM | POA: Insufficient documentation

## 2015-12-22 DIAGNOSIS — R079 Chest pain, unspecified: Secondary | ICD-10-CM | POA: Diagnosis present

## 2015-12-22 DIAGNOSIS — E079 Disorder of thyroid, unspecified: Secondary | ICD-10-CM | POA: Diagnosis not present

## 2015-12-22 LAB — BASIC METABOLIC PANEL
Anion gap: 12 (ref 5–15)
BUN: 10 mg/dL (ref 6–20)
CO2: 22 mmol/L (ref 22–32)
Calcium: 9.4 mg/dL (ref 8.9–10.3)
Chloride: 106 mmol/L (ref 101–111)
Creatinine, Ser: 0.95 mg/dL (ref 0.44–1.00)
GFR calc Af Amer: >60 mL/min (ref >60)
GFR calc non Af Amer: >60 mL/min (ref >60)
Glucose, Bld: 104 mg/dL — ABNORMAL HIGH (ref 65–99)
Potassium: 3.6 mmol/L (ref 3.5–5.1)
Sodium: 140 mmol/L (ref 135–145)

## 2015-12-22 LAB — CBC
HCT: 37.5 % (ref 36.0–46.0)
Hemoglobin: 12.8 g/dL (ref 12.0–15.0)
MCH: 28.5 pg (ref 26.0–34.0)
MCHC: 34.1 g/dL (ref 30.0–36.0)
MCV: 83.5 fL (ref 78.0–100.0)
Platelets: 494 10*3/uL — ABNORMAL HIGH (ref 150–400)
RBC: 4.49 MIL/uL (ref 3.87–5.11)
RDW: 13.5 % (ref 11.5–15.5)
WBC: 5.6 10*3/uL (ref 4.0–10.5)

## 2015-12-22 LAB — I-STAT TROPONIN, ED: Troponin i, poc: 0 ng/mL (ref 0.00–0.08)
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
## 2015-12-22 NOTE — ED Notes (Signed)
Pt states she works at [employer] and felt like her chest was sore when she left work this morning.  States she related it to a busy night and went home and went to bed.  Continues to have L sided chest soreness that radiates to back (between shoulder blades) with sob and nausea. Also c/o tingling to L arm/fingers.

## 2015-12-23 DIAGNOSIS — Z87891 Personal history of nicotine dependence: Secondary | ICD-10-CM | POA: Diagnosis not present

## 2015-12-23 DIAGNOSIS — R202 Paresthesia of skin: Secondary | ICD-10-CM | POA: Diagnosis not present

## 2015-12-23 DIAGNOSIS — Z79899 Other long term (current) drug therapy: Secondary | ICD-10-CM | POA: Diagnosis not present

## 2015-12-23 DIAGNOSIS — R0789 Other chest pain: Secondary | ICD-10-CM | POA: Diagnosis not present

## 2015-12-23 DIAGNOSIS — R200 Anesthesia of skin: Secondary | ICD-10-CM | POA: Diagnosis not present

## 2015-12-23 DIAGNOSIS — E079 Disorder of thyroid, unspecified: Secondary | ICD-10-CM | POA: Diagnosis not present

## 2015-12-23 DIAGNOSIS — Z862 Personal history of diseases of the blood and blood-forming organs and certain disorders involving the immune mechanism: Secondary | ICD-10-CM | POA: Diagnosis not present

## 2015-12-23 DIAGNOSIS — N390 Urinary tract infection, site not specified: Secondary | ICD-10-CM | POA: Diagnosis not present

## 2015-12-23 DIAGNOSIS — M546 Pain in thoracic spine: Secondary | ICD-10-CM | POA: Diagnosis not present

## 2015-12-23 LAB — URINALYSIS, ROUTINE W REFLEX MICROSCOPIC
Bilirubin Urine: NEGATIVE
Glucose, UA: NEGATIVE mg/dL
Ketones, ur: NEGATIVE mg/dL
Nitrite: POSITIVE — AB
Protein, ur: NEGATIVE mg/dL
Specific Gravity, Urine: 1.012 (ref 1.005–1.030)
pH: 5.5 (ref 5.0–8.0)

## 2015-12-23 LAB — URINE MICROSCOPIC-ADD ON

## 2015-12-23 LAB — I-STAT TROPONIN, ED: Troponin i, poc: 0 ng/mL (ref 0.00–0.08)

## 2015-12-23 LAB — D-DIMER, QUANTITATIVE: D-Dimer, Quant: 0.49 ug/mL-FEU (ref 0.00–0.50)

## 2015-12-23 MED ORDER — METHOCARBAMOL 500 MG PO TABS
1000.0000 mg | ORAL_TABLET | Freq: Once | ORAL | Status: AC
  Administered 2015-12-23: 1000 mg via ORAL
  Filled 2015-12-23: qty 2

## 2015-12-23 MED ORDER — IBUPROFEN 600 MG PO TABS: 600.0000 mg | ORAL_TABLET | Freq: Three times a day (TID) | ORAL | Status: DC | PRN

## 2015-12-23 MED ORDER — CEPHALEXIN 250 MG PO CAPS
500.0000 mg | ORAL_CAPSULE | Freq: Once | ORAL | Status: AC
  Administered 2015-12-23: 500 mg via ORAL
  Filled 2015-12-23: qty 2

## 2015-12-23 MED ORDER — METHOCARBAMOL 500 MG PO TABS: 1000.0000 mg | ORAL_TABLET | Freq: Three times a day (TID) | ORAL | Status: DC | PRN

## 2015-12-23 MED ORDER — KETOROLAC TROMETHAMINE 60 MG/2ML IM SOLN
60.0000 mg | Freq: Once | INTRAMUSCULAR | Status: AC
  Administered 2015-12-23: 60 mg via INTRAMUSCULAR
  Filled 2015-12-23: qty 2

## 2015-12-23 MED ORDER — CEPHALEXIN 500 MG PO CAPS: 500.0000 mg | ORAL_CAPSULE | Freq: Three times a day (TID) | ORAL | Status: DC

## 2015-12-23 NOTE — Discharge Instructions (Signed)
Chest Wall Pain Chest wall pain is pain in or around the bones and muscles of your chest. Sometimes, an injury causes this pain. Sometimes, the cause may not be known. This pain may take several weeks or longer to get better. HOME CARE INSTRUCTIONS  Pay attention to any changes in your symptoms. Take these actions to help with your pain:   Rest as told by your health care provider.   Avoid activities that cause pain. These include any activities that use your chest muscles or your abdominal and side muscles to lift heavy items.   If directed, apply ice to the painful area:  Put ice in a plastic bag.  Place a towel between your skin and the bag.  Leave the ice on for 20 minutes, 2-3 times per day.  Take over-the-counter and prescription medicines only as told by your health care provider.  Do not use tobacco products, including cigarettes, chewing tobacco, and e-cigarettes. If you need help quitting, ask your health care provider.  Keep all follow-up visits as told by your health care provider. This is important. SEEK MEDICAL CARE IF:  You have a fever.  Your chest pain becomes worse.  You have new symptoms. SEEK IMMEDIATE MEDICAL CARE IF:  You have nausea or vomiting.  You feel sweaty or light-headed.  You have a cough with phlegm (sputum) or you cough up blood.  You develop shortness of breath.   This information is not intended to replace advice given to you by your health care provider. Make sure you discuss any questions you have with your health care provider.   Document Released: 12/07/2005 Document Revised: 08/28/2015 Document Reviewed: 03/04/2015 Elsevier Interactive Patient Education 2016 Else[employer]  Urinary Tract Infection A urinary tract infection (UTI) can occur any place along the urinary tract. The tract includes the kidneys, ureters, bladder, and urethra. A type of germ called bacteria often causes a UTI. UTIs are often helped with antibiotic  medicine.  HOME CARE   If given, take antibiotics as told by your doctor. Finish them even if you start to feel better.  Drink enough fluids to keep your pee (urine) clear or pale yellow.  Avoid tea, drinks with caffeine, and bubbly (carbonated) drinks.  Pee often. Avoid holding your pee in for a long time.  Pee before and after having sex (intercourse).  Wipe from front to back after you poop (bowel movement) if you are a woman. Use each tissue only once. GET HELP RIGHT AWAY IF:   You have back pain.  You have lower belly (abdominal) pain.  You have chills.  You feel sick to your stomach (nauseous).  You throw up (vomit).  Your burning or discomfort with peeing does not go away.  You have a fever.  Your symptoms are not better in 3 days. MAKE SURE YOU:   Understand these instructions.  Will watch your condition.  Will get help right away if you are not doing well or get worse.   This information is not intended to replace advice given to you by your health care provider. Make sure you discuss any questions you have with your health care provider.   Document Released: 05/25/2008 Document Revised: 12/28/2014 Document Reviewed: 07/07/2012 Elsevier Interactive Patient Education 2016 [employer]. Muscle Strain A muscle strain is an injury that occurs when a muscle is stretched beyond its normal length. Usually a small number of muscle fibers are torn when this happens. Muscle strain is rated in degrees. First-degree strains  have the least amount of muscle fiber tearing and pain. Second-degree and third-degree strains have increasingly more tearing and pain.  Usually, recovery from muscle strain takes 1-2 weeks. Complete healing takes 5-6 weeks.  CAUSES  Muscle strain happens when a sudden, violent force placed on a muscle stretches it too far. This may occur with lifting, sports, or a fall.  RISK FACTORS Muscle strain is especially common in athletes.  SIGNS AND  SYMPTOMS At the site of the muscle strain, there may be:  Pain.  Bruising.  Swelling.  Difficulty using the muscle due to pain or lack of normal function. DIAGNOSIS  Your health care provider will perform a physical exam and ask about your medical history. TREATMENT  Often, the best treatment for a muscle strain is resting, icing, and applying cold compresses to the injured area.  HOME CARE INSTRUCTIONS   Use the PRICE method of treatment to promote muscle healing during the first 2-3 days after your injury. The PRICE method involves:  Protecting the muscle from being injured again.  Restricting your activity and resting the injured body part.  Icing your injury. To do this, put ice in a plastic bag. Place a towel between your skin and the bag. Then, apply the ice and leave it on from 15-20 minutes each hour. After the third day, switch to moist heat packs.  Apply compression to the injured area with a splint or elastic bandage. Be careful not to wrap it too tightly. This may interfere with blood circulation or increase swelling.  Elevate the injured body part above the level of your heart as often as you can.  Only take over-the-counter or prescription medicines for pain, discomfort, or fever as directed by your health care provider.  Warming up prior to exercise helps to prevent future muscle strains. SEEK MEDICAL CARE IF:   You have increasing pain or swelling in the injured area.  You have numbness, tingling, or a significant loss of strength in the injured area. MAKE SURE YOU:   Understand these instructions.  Will watch your condition.  Will get help right away if you are not doing well or get worse.   This information is not intended to replace advice given to you by your health care provider. Make sure you discuss any questions you have with your health care provider.   Document Released: 12/07/2005 Document Revised: 09/27/2013 Document Reviewed:  07/06/2013 Elsevier Interactive Patient Education [employer].

## 2016-07-16 ENCOUNTER — Encounter: Payer: Self-pay | Admitting: Urgent Care

## 2016-07-16 ENCOUNTER — Emergency Department: Payer: 59

## 2016-07-16 DIAGNOSIS — Z87891 Personal history of nicotine dependence: Secondary | ICD-10-CM | POA: Insufficient documentation

## 2016-07-16 DIAGNOSIS — Z79899 Other long term (current) drug therapy: Secondary | ICD-10-CM | POA: Insufficient documentation

## 2016-07-16 DIAGNOSIS — I2510 Atherosclerotic heart disease of native coronary artery without angina pectoris: Secondary | ICD-10-CM | POA: Insufficient documentation

## 2016-07-16 DIAGNOSIS — E039 Hypothyroidism, unspecified: Secondary | ICD-10-CM | POA: Insufficient documentation

## 2016-07-16 DIAGNOSIS — R0602 Shortness of breath: Secondary | ICD-10-CM | POA: Insufficient documentation

## 2016-07-16 DIAGNOSIS — R0789 Other chest pain: Secondary | ICD-10-CM | POA: Insufficient documentation

## 2016-07-16 DIAGNOSIS — N390 Urinary tract infection, site not specified: Secondary | ICD-10-CM | POA: Diagnosis not present

## 2016-07-16 DIAGNOSIS — R079 Chest pain, unspecified: Secondary | ICD-10-CM | POA: Diagnosis not present

## 2016-07-16 LAB — BASIC METABOLIC PANEL
Anion gap: 11 (ref 5–15)
BUN: 11 mg/dL (ref 6–20)
CO2: 23 mmol/L (ref 22–32)
Calcium: 9.7 mg/dL (ref 8.9–10.3)
Chloride: 105 mmol/L (ref 101–111)
Creatinine, Ser: 0.85 mg/dL (ref 0.44–1.00)
GFR calc Af Amer: >60 mL/min (ref >60)
GFR calc non Af Amer: >60 mL/min (ref >60)
Glucose, Bld: 94 mg/dL (ref 65–99)
Potassium: 3.5 mmol/L (ref 3.5–5.1)
Sodium: 139 mmol/L (ref 135–145)

## 2016-07-16 LAB — CBC
HCT: 38.5 % (ref 35.0–47.0)
Hemoglobin: 12.9 g/dL (ref 12.0–16.0)
MCH: 27.5 pg (ref 26.0–34.0)
MCHC: 33.5 g/dL (ref 32.0–36.0)
MCV: 82 fL (ref 80.0–100.0)
Platelets: 474 10*3/uL — ABNORMAL HIGH (ref 150–440)
RBC: 4.69 MIL/uL (ref 3.80–5.20)
RDW: 13.8 % (ref 11.5–14.5)
WBC: 6.2 10*3/uL (ref 3.6–11.0)

## 2016-07-16 LAB — TROPONIN I: Troponin I: <0.03 ng/mL (ref <0.03)

## 2016-07-16 NOTE — ED Triage Notes (Signed)
Patient presents with c/o pain to her chest and subscapular area that began earlier this morning. Patient reports pain and tingling to her LUE. (+) N/V reported. Patient reports that she didn't sleep well today; slept sitting up right. (+) palpitations throughout the day.

## 2016-07-17 ENCOUNTER — Emergency Department
Admission: EM | Admit: 2016-07-17 | Discharge: 2016-07-17 | Disposition: A | Discharge status: Home / Self Care | Payer: 59 | Attending: Emergency Medicine | Admitting: Emergency Medicine

## 2016-07-17 ENCOUNTER — Emergency Department: Payer: 59

## 2016-07-17 DIAGNOSIS — R0789 Other chest pain: Secondary | ICD-10-CM | POA: Diagnosis not present

## 2016-07-17 DIAGNOSIS — Z79899 Other long term (current) drug therapy: Secondary | ICD-10-CM | POA: Diagnosis not present

## 2016-07-17 DIAGNOSIS — N390 Urinary tract infection, site not specified: Secondary | ICD-10-CM | POA: Diagnosis not present

## 2016-07-17 DIAGNOSIS — E039 Hypothyroidism, unspecified: Secondary | ICD-10-CM

## 2016-07-17 DIAGNOSIS — Z87891 Personal history of nicotine dependence: Secondary | ICD-10-CM | POA: Diagnosis not present

## 2016-07-17 DIAGNOSIS — R0602 Shortness of breath: Secondary | ICD-10-CM | POA: Diagnosis not present

## 2016-07-17 DIAGNOSIS — R079 Chest pain, unspecified: Secondary | ICD-10-CM | POA: Diagnosis not present

## 2016-07-17 DIAGNOSIS — I2510 Atherosclerotic heart disease of native coronary artery without angina pectoris: Secondary | ICD-10-CM | POA: Diagnosis not present

## 2016-07-17 LAB — LIPASE, BLOOD: Lipase: 20 U/L (ref 11–51)

## 2016-07-17 LAB — TROPONIN I: Troponin I: <0.03 ng/mL (ref <0.03)

## 2016-07-17 LAB — URINALYSIS COMPLETE WITH MICROSCOPIC (ARMC ONLY)
Bilirubin Urine: NEGATIVE
Glucose, UA: NEGATIVE mg/dL
Ketones, ur: NEGATIVE mg/dL
Nitrite: POSITIVE — AB
Protein, ur: 30 mg/dL — AB
Specific Gravity, Urine: 1.01 (ref 1.005–1.030)
pH: 6 (ref 5.0–8.0)

## 2016-07-17 LAB — TSH: TSH: 14.455 u[IU]/mL — ABNORMAL HIGH (ref 0.350–4.500)

## 2016-07-17 LAB — HEPATIC FUNCTION PANEL
ALT: 27 U/L (ref 14–54)
AST: 27 U/L (ref 15–41)
Albumin: 4.6 g/dL (ref 3.5–5.0)
Alkaline Phosphatase: 66 U/L (ref 38–126)
Bilirubin, Direct: <0.1 mg/dL — ABNORMAL LOW (ref 0.1–0.5)
Total Bilirubin: 0.5 mg/dL (ref 0.3–1.2)
Total Protein: 8.2 g/dL — ABNORMAL HIGH (ref 6.5–8.1)

## 2016-07-17 MED ORDER — LEVOTHYROXINE SODIUM 125 MCG PO TABS: 125.0000 ug | ORAL_TABLET | Freq: Every day | ORAL | 0 refills | Status: DC

## 2016-07-17 MED ORDER — CEPHALEXIN 500 MG PO CAPS
500.0000 mg | ORAL_CAPSULE | Freq: Once | ORAL | Status: AC
  Administered 2016-07-17: 500 mg via ORAL
  Filled 2016-07-17: qty 1

## 2016-07-17 MED ORDER — HYDROCODONE-ACETAMINOPHEN 5-325 MG PO TABS: 1.0000 | ORAL_TABLET | Freq: Four times a day (QID) | ORAL | 0 refills | Status: DC | PRN

## 2016-07-17 MED ORDER — CEPHALEXIN 500 MG PO CAPS: 500.0000 mg | ORAL_CAPSULE | Freq: Three times a day (TID) | ORAL | 0 refills | Status: DC

## 2016-07-17 MED ORDER — IOPAMIDOL (ISOVUE-370) INJECTION 76%
100.0000 mL | Freq: Once | INTRAVENOUS | Status: AC | PRN
  Administered 2016-07-17: 100 mL via INTRAVENOUS

## 2016-07-17 MED ORDER — IBUPROFEN 800 MG PO TABS: 800.0000 mg | ORAL_TABLET | Freq: Three times a day (TID) | ORAL | 0 refills | Status: DC | PRN

## 2016-07-17 MED ORDER — KETOROLAC TROMETHAMINE 30 MG/ML IJ SOLN
10.0000 mg | Freq: Once | INTRAMUSCULAR | Status: AC
  Administered 2016-07-17: 9.9 mg via INTRAVENOUS
  Filled 2016-07-17: qty 1

## 2016-07-17 NOTE — Discharge Instructions (Signed)
1. Restart your thyroid medication. 2. Take antibiotic as prescribed (Keflex 500 mg 3 times daily 7 days). 3. Take pain medicines as needed (Motrin/Norco #15). 4. Apply moist heat to affected area several times daily. 5. Return to the ER for worsening symptoms, persistent vomiting, difficult to breathing or other concerns.
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
## 2016-07-17 NOTE — ED Notes (Signed)
Patient transported to CT 

## 2016-10-20 ENCOUNTER — Ambulatory Visit (INDEPENDENT_AMBULATORY_CARE_PROVIDER_SITE_OTHER): Payer: 59 | Admitting: Primary Care

## 2016-10-20 ENCOUNTER — Encounter: Payer: Self-pay | Admitting: Primary Care

## 2016-10-20 VITALS — BP 152/92 | HR 86 | Temp 97.8°F | Ht 64.0 in | Wt 192.8 lb

## 2016-10-20 DIAGNOSIS — E039 Hypothyroidism, unspecified: Secondary | ICD-10-CM | POA: Diagnosis not present

## 2016-10-20 DIAGNOSIS — R030 Elevated blood-pressure reading, without diagnosis of hypertension: Secondary | ICD-10-CM | POA: Diagnosis not present

## 2016-10-20 DIAGNOSIS — R5383 Other fatigue: Secondary | ICD-10-CM | POA: Diagnosis not present

## 2016-10-20 DIAGNOSIS — I10 Essential (primary) hypertension: Secondary | ICD-10-CM | POA: Insufficient documentation

## 2016-10-20 LAB — COMPREHENSIVE METABOLIC PANEL
ALT: 15 U/L (ref 0–35)
AST: 14 U/L (ref 0–37)
Albumin: 4.3 g/dL (ref 3.5–5.2)
Alkaline Phosphatase: 52 U/L (ref 39–117)
BUN: 11 mg/dL (ref 6–23)
CO2: 27 mEq/L (ref 19–32)
Calcium: 9.5 mg/dL (ref 8.4–10.5)
Chloride: 105 mEq/L (ref 96–112)
Creatinine, Ser: 0.74 mg/dL (ref 0.40–1.20)
GFR: 107.57 mL/min (ref >60.00)
Glucose, Bld: 100 mg/dL — ABNORMAL HIGH (ref 70–99)
Potassium: 3.8 mEq/L (ref 3.5–5.1)
Sodium: 139 mEq/L (ref 135–145)
Total Bilirubin: 0.3 mg/dL (ref 0.2–1.2)
Total Protein: 7.4 g/dL (ref 6.0–8.3)

## 2016-10-20 LAB — LIPID PANEL
Cholesterol: 188 mg/dL (ref 0–200)
HDL: 44.4 mg/dL (ref >39.00)
LDL Cholesterol: 118 mg/dL — ABNORMAL HIGH (ref 0–99)
NonHDL: 143.81
Total CHOL/HDL Ratio: 4
Triglycerides: 130 mg/dL (ref 0.0–149.0)
VLDL: 26 mg/dL (ref 0.0–40.0)

## 2016-10-20 LAB — HEMOGLOBIN A1C: Hgb A1c MFr Bld: 5.6 % (ref 4.6–6.5)

## 2016-10-20 LAB — CBC
HCT: 36 % (ref 36.0–46.0)
Hemoglobin: 12.1 g/dL (ref 12.0–15.0)
MCHC: 33.8 g/dL (ref 30.0–36.0)
MCV: 82.7 fl (ref 78.0–100.0)
Platelets: 439 10*3/uL — ABNORMAL HIGH (ref 150.0–400.0)
RBC: 4.35 Mil/uL (ref 3.87–5.11)
RDW: 14.2 % (ref 11.5–15.5)
WBC: 10.1 10*3/uL (ref 4.0–10.5)

## 2016-10-20 LAB — TSH: TSH: 16.04 u[IU]/mL — ABNORMAL HIGH (ref 0.35–4.50)

## 2016-10-20 MED ORDER — LEVOTHYROXINE SODIUM 125 MCG PO TABS: 125.0000 ug | ORAL_TABLET | Freq: Every day | ORAL | 0 refills | Status: DC
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
## 2016-10-20 NOTE — Assessment & Plan Note (Signed)
Ongoing for 1 month. Also with polyuria, 1 episode of visual change, polydipsia.  Could be secondary to poorly managed hypothyroidism, hypertension, or undiagnosed diabetes. CBC, TSH, A1C, BMP, pending.

## 2016-10-20 NOTE — Patient Instructions (Addendum)
Complete lab work prior to leaving today.   Check your blood pressure daily, around the same time of day, for the next 2 weeks.  Ensure that you have rested for 30 minutes prior to checking your blood pressure. Record your readings and I will call you for those readings in 2 weeks.  I sent a refill of your levothyroxine 125 mcg.   I will be in touch tomorrow regarding your results.  It was a pleasure to meet you today! Please don't hesitate to call me with any questions. Welcome to [facility]!  DASH Eating Plan DASH stands for "Dietary Approaches to Stop Hypertension." The DASH eating plan is a healthy eating plan that has been shown to reduce high blood pressure (hypertension). Additional health benefits may include reducing the risk of type 2 diabetes mellitus, heart disease, and stroke. The DASH eating plan may also help with weight loss. WHAT DO I NEED TO KNOW ABOUT THE DASH EATING PLAN? For the DASH eating plan, you will follow these general guidelines:  Choose foods with a percent daily value for sodium of less than 5% (as listed on the food label).  Use salt-free seasonings or herbs instead of table salt or sea salt.  Check with your health care provider or pharmacist before using salt substitutes.  Eat lower-sodium products, often labeled as "lower sodium" or "no salt added."  Eat fresh foods.  Eat more vegetables, fruits, and low-fat dairy products.  Choose whole grains. Look for the word "whole" as the first word in the ingredient list.  Choose fish and skinless chicken or [place] more often than red meat. Limit fish, poultry, and meat to 6 oz (170 g) each day.  Limit sweets, desserts, sugars, and sugary drinks.  Choose heart-healthy fats.  Limit cheese to 1 oz (28 g) per day.  Eat more home-cooked food and less restaurant, buffet, and fast food.  Limit fried foods.  Cook foods using methods other than frying.  Limit canned vegetables. If you do use them, rinse them  well to decrease the sodium.  When eating at a restaurant, ask that your food be prepared with less salt, or no salt if possible. WHAT FOODS CAN I EAT? Seek help from a dietitian for individual calorie needs. Grains Whole grain or whole wheat bread. Brown rice. Whole grain or whole wheat pasta. Quinoa, bulgur, and whole grain cereals. Low-sodium cereals. Corn or whole wheat flour tortillas. Whole grain cornbread. Whole grain crackers. Low-sodium crackers. Vegetables Fresh or frozen vegetables (raw, steamed, roasted, or grilled). Low-sodium or reduced-sodium tomato and vegetable juices. Low-sodium or reduced-sodium tomato sauce and paste. Low-sodium or reduced-sodium canned vegetables.  Fruits All fresh, canned (in natural juice), or frozen fruits. Meat and Other Protein Products Ground beef (85% or leaner), grass-fed beef, or beef trimmed of fat. Skinless chicken or [place]. Ground chicken or [place]. Pork trimmed of fat. All fish and seafood. Eggs. Dried beans, peas, or lentils. Unsalted nuts and seeds. Unsalted canned beans. Dairy Low-fat dairy products, such as skim or 1% milk, 2% or reduced-fat cheeses, low-fat ricotta or cottage cheese, or plain low-fat yogurt. Low-sodium or reduced-sodium cheeses. Fats and Oils Tub margarines without trans fats. Light or reduced-fat mayonnaise and salad dressings (reduced sodium). Avocado. Safflower, olive, or canola oils. Natural peanut or almond butter. Other Unsalted popcorn and pretzels. The items listed above may not be a complete list of recommended foods or beverages. Contact your dietitian for more options. WHAT FOODS ARE NOT RECOMMENDED? Grains White bread. White  pasta. White rice. Refined cornbread. Bagels and croissants. Crackers that contain trans fat. Vegetables Creamed or fried vegetables. Vegetables in a cheese sauce. Regular canned vegetables. Regular canned tomato sauce and paste. Regular tomato and vegetable juices. Fruits Dried  fruits. Canned fruit in light or heavy syrup. Fruit juice. Meat and Other Protein Products Fatty cuts of meat. Ribs, chicken wings, bacon, sausage, bologna, salami, chitterlings, fatback, hot dogs, bratwurst, and packaged luncheon meats. Salted nuts and seeds. Canned beans with salt. Dairy Whole or 2% milk, cream, half-and-half, and cream cheese. Whole-fat or sweetened yogurt. Full-fat cheeses or blue cheese. Nondairy creamers and whipped toppings. Processed cheese, cheese spreads, or cheese curds. Condiments Onion and garlic salt, seasoned salt, table salt, and sea salt. Canned and packaged gravies. Worcestershire sauce. Tartar sauce. Barbecue sauce. Teriyaki sauce. Soy sauce, including reduced sodium. Steak sauce. Fish sauce. Oyster sauce. Cocktail sauce. Horseradish. Ketchup and mustard. Meat flavorings and tenderizers. Bouillon cubes. Hot sauce. Tabasco sauce. Marinades. Taco seasonings. Relishes. Fats and Oils Butter, stick margarine, lard, shortening, ghee, and bacon fat. Coconut, palm kernel, or palm oils. Regular salad dressings. Other Pickles and olives. Salted popcorn and pretzels. The items listed above may not be a complete list of foods and beverages to avoid. Contact your dietitian for more information. WHERE CAN I FIND MORE INFORMATION? National Heart, Lung, and Blood Institute: travelstabloid.com   This information is not intended to replace advice given to you by your health care provider. Make sure you discuss any questions you have with your health care provider.   Document Released: 11/26/2011 Document Revised: 12/28/2014 Document Reviewed: 10/11/2013 Elsevier Interactive Patient Education [employer].

## 2016-10-20 NOTE — Assessment & Plan Note (Signed)
Above goal in office today. Endorses weight gain due to poor diet and inactivity. Discussed DASH diet. Will have her monitor BP over next 2 weeks, if above goal then will initiate treatment.

## 2016-10-20 NOTE — Assessment & Plan Note (Signed)
Diagnosed in 2003 after thyroid gland removal. Infrequent use of levothyroxine for the past 1-2 years. Out of meds for the past 2 days. Refill sent to pharmacy. TSH pending, likely above goal given history and symptoms. Will closely monitor.

## 2016-10-20 NOTE — Progress Notes (Signed)
Pre visit review using our clinic review tool, if applicable. No additional management support is needed unless otherwise documented below in the visit note. 

## 2016-10-23 ENCOUNTER — Other Ambulatory Visit: Payer: Self-pay | Admitting: Primary Care

## 2016-10-27 ENCOUNTER — Encounter: Payer: Self-pay | Admitting: Primary Care

## 2016-10-27 ENCOUNTER — Ambulatory Visit (INDEPENDENT_AMBULATORY_CARE_PROVIDER_SITE_OTHER): Payer: 59 | Admitting: Primary Care

## 2016-10-27 VITALS — BP 136/80 | HR 72 | Temp 98.3°F | Ht 64.0 in | Wt 193.8 lb

## 2016-10-27 DIAGNOSIS — Z1231 Encounter for screening mammogram for malignant neoplasm of breast: Secondary | ICD-10-CM | POA: Diagnosis not present

## 2016-10-27 DIAGNOSIS — R030 Elevated blood-pressure reading, without diagnosis of hypertension: Secondary | ICD-10-CM

## 2016-10-27 DIAGNOSIS — Z0000 Encounter for general adult medical examination without abnormal findings: Secondary | ICD-10-CM | POA: Diagnosis not present

## 2016-10-27 DIAGNOSIS — E039 Hypothyroidism, unspecified: Secondary | ICD-10-CM

## 2016-10-27 DIAGNOSIS — R5383 Other fatigue: Secondary | ICD-10-CM

## 2016-10-27 DIAGNOSIS — Z0001 Encounter for general adult medical examination with abnormal findings: Secondary | ICD-10-CM | POA: Insufficient documentation

## 2016-10-27 DIAGNOSIS — Z23 Encounter for immunization: Secondary | ICD-10-CM

## 2016-10-27 DIAGNOSIS — Z1239 Encounter for other screening for malignant neoplasm of breast: Secondary | ICD-10-CM

## 2016-10-27 NOTE — Assessment & Plan Note (Signed)
Improved in the office today. Some elevated readings at home. Suspect this will improve with gradual treatment of levothyroxine.  Will have her continue to work on healthy diet and exercise. Information provided regarding DASH diet.

## 2016-10-27 NOTE — Assessment & Plan Note (Signed)
Overall improved on levothyroxine. Repeat TSH in 4 weeks.

## 2016-10-27 NOTE — Progress Notes (Signed)
Pre visit review using our clinic review tool, if applicable. No additional management support is needed unless otherwise documented below in the visit note. 
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
## 2016-10-27 NOTE — Assessment & Plan Note (Signed)
Td due, provided today. Influenza vaccination UTD. Mammogram due, ordered today. Discussed the importance of a healthy diet and regular exercise in order for weight loss, and to reduce the risk of other medical diseases. Exam unremarkable. Labs stable. Recheck TSH in 4 weeks. Follow up in 1 year for repeat physical.

## 2016-10-27 NOTE — Patient Instructions (Addendum)
Schedule a lab only appointment in 4 weeks for recheck of your thyroid funciton.  Continue to monitor your blood pressure 1-2 times weekly. Please notify me if you consistently get readings at or above 140/90.'  It's importance to improve your diet by reducing consumption of fast food, fried food, processed snack foods, sugary drinks. Increase consumption of fresh vegetables and fruits, whole grains, water.  Ensure you are drinking 64 ounces of water daily.  Continue exercising. You should be getting 150 minutes of moderate intensity exercise weekly.  Follow up in 1 year for repeat physical in 1 year or sooner if needed.  DASH Eating Plan DASH stands for "Dietary Approaches to Stop Hypertension." The DASH eating plan is a healthy eating plan that has been shown to reduce high blood pressure (hypertension). Additional health benefits may include reducing the risk of type 2 diabetes mellitus, heart disease, and stroke. The DASH eating plan may also help with weight loss. WHAT DO I NEED TO KNOW ABOUT THE DASH EATING PLAN? For the DASH eating plan, you will follow these general guidelines:  Choose foods with a percent daily value for sodium of less than 5% (as listed on the food label).  Use salt-free seasonings or herbs instead of table salt or sea salt.  Check with your health care provider or pharmacist before using salt substitutes.  Eat lower-sodium products, often labeled as "lower sodium" or "no salt added."  Eat fresh foods.  Eat more vegetables, fruits, and low-fat dairy products.  Choose whole grains. Look for the word "whole" as the first word in the ingredient list.  Choose fish and skinless chicken or [place] more often than red meat. Limit fish, poultry, and meat to 6 oz (170 g) each day.  Limit sweets, desserts, sugars, and sugary drinks.  Choose heart-healthy fats.  Limit cheese to 1 oz (28 g) per day.  Eat more home-cooked food and less restaurant, buffet, and fast  food.  Limit fried foods.  Cook foods using methods other than frying.  Limit canned vegetables. If you do use them, rinse them well to decrease the sodium.  When eating at a restaurant, ask that your food be prepared with less salt, or no salt if possible. WHAT FOODS CAN I EAT? Seek help from a dietitian for individual calorie needs. Grains Whole grain or whole wheat bread. Brown rice. Whole grain or whole wheat pasta. Quinoa, bulgur, and whole grain cereals. Low-sodium cereals. Corn or whole wheat flour tortillas. Whole grain cornbread. Whole grain crackers. Low-sodium crackers. Vegetables Fresh or frozen vegetables (raw, steamed, roasted, or grilled). Low-sodium or reduced-sodium tomato and vegetable juices. Low-sodium or reduced-sodium tomato sauce and paste. Low-sodium or reduced-sodium canned vegetables.  Fruits All fresh, canned (in natural juice), or frozen fruits. Meat and Other Protein Products Ground beef (85% or leaner), grass-fed beef, or beef trimmed of fat. Skinless chicken or [place]. Ground chicken or [place]. Pork trimmed of fat. All fish and seafood. Eggs. Dried beans, peas, or lentils. Unsalted nuts and seeds. Unsalted canned beans. Dairy Low-fat dairy products, such as skim or 1% milk, 2% or reduced-fat cheeses, low-fat ricotta or cottage cheese, or plain low-fat yogurt. Low-sodium or reduced-sodium cheeses. Fats and Oils Tub margarines without trans fats. Light or reduced-fat mayonnaise and salad dressings (reduced sodium). Avocado. Safflower, olive, or canola oils. Natural peanut or almond butter. Other Unsalted popcorn and pretzels. The items listed above may not be a complete list of recommended foods or beverages. Contact your dietitian for more  options. WHAT FOODS ARE NOT RECOMMENDED? Grains White bread. White pasta. White rice. Refined cornbread. Bagels and croissants. Crackers that contain trans fat. Vegetables Creamed or fried vegetables. Vegetables in a  cheese sauce. Regular canned vegetables. Regular canned tomato sauce and paste. Regular tomato and vegetable juices. Fruits Dried fruits. Canned fruit in light or heavy syrup. Fruit juice. Meat and Other Protein Products Fatty cuts of meat. Ribs, chicken wings, bacon, sausage, bologna, salami, chitterlings, fatback, hot dogs, bratwurst, and packaged luncheon meats. Salted nuts and seeds. Canned beans with salt. Dairy Whole or 2% milk, cream, half-and-half, and cream cheese. Whole-fat or sweetened yogurt. Full-fat cheeses or blue cheese. Nondairy creamers and whipped toppings. Processed cheese, cheese spreads, or cheese curds. Condiments Onion and garlic salt, seasoned salt, table salt, and sea salt. Canned and packaged gravies. Worcestershire sauce. Tartar sauce. Barbecue sauce. Teriyaki sauce. Soy sauce, including reduced sodium. Steak sauce. Fish sauce. Oyster sauce. Cocktail sauce. Horseradish. Ketchup and mustard. Meat flavorings and tenderizers. Bouillon cubes. Hot sauce. Tabasco sauce. Marinades. Taco seasonings. Relishes. Fats and Oils Butter, stick margarine, lard, shortening, ghee, and bacon fat. Coconut, palm kernel, or palm oils. Regular salad dressings. Other Pickles and olives. Salted popcorn and pretzels. The items listed above may not be a complete list of foods and beverages to avoid. Contact your dietitian for more information. WHERE CAN I FIND MORE INFORMATION? National Heart, Lung, and Blood Institute: travelstabloid.com   This information is not intended to replace advice given to you by your health care provider. Make sure you discuss any questions you have with your health care provider.   Document Released: 11/26/2011 Document Revised: 12/28/2014 Document Reviewed: 10/11/2013 Elsevier Interactive Patient Education [employer].

## 2016-10-27 NOTE — Assessment & Plan Note (Addendum)
TSH of 16 on prior labs.  Re-initiated her levothyroxine 125 mcg last visit, already feeling an improvement. Will have her schedule repeat TSH in 1 month.

## 2016-10-28 DIAGNOSIS — E039 Hypothyroidism, unspecified: Secondary | ICD-10-CM | POA: Diagnosis not present

## 2016-10-28 DIAGNOSIS — R030 Elevated blood-pressure reading, without diagnosis of hypertension: Secondary | ICD-10-CM | POA: Diagnosis not present

## 2016-10-28 DIAGNOSIS — Z0000 Encounter for general adult medical examination without abnormal findings: Secondary | ICD-10-CM | POA: Diagnosis not present

## 2016-10-28 DIAGNOSIS — Z23 Encounter for immunization: Secondary | ICD-10-CM | POA: Diagnosis not present

## 2016-10-28 DIAGNOSIS — Z1231 Encounter for screening mammogram for malignant neoplasm of breast: Secondary | ICD-10-CM | POA: Diagnosis not present

## 2016-10-28 NOTE — Addendum Note (Signed)
Addended by: Jacqualin Combes on: 10/28/2016 07:55 AM   Modules accepted: Orders

## 2016-11-02 DIAGNOSIS — H524 Presbyopia: Secondary | ICD-10-CM | POA: Diagnosis not present

## 2016-11-02 DIAGNOSIS — H5212 Myopia, left eye: Secondary | ICD-10-CM | POA: Diagnosis not present

## 2016-11-02 DIAGNOSIS — H52223 Regular astigmatism, bilateral: Secondary | ICD-10-CM | POA: Diagnosis not present

## 2016-11-03 ENCOUNTER — Telehealth: Payer: Self-pay | Admitting: Primary Care

## 2016-11-03 ENCOUNTER — Ambulatory Visit (INDEPENDENT_AMBULATORY_CARE_PROVIDER_SITE_OTHER): Payer: 59 | Admitting: Primary Care

## 2016-11-03 ENCOUNTER — Encounter: Payer: Self-pay | Admitting: Primary Care

## 2016-11-03 VITALS — BP 140/82 | HR 98 | Temp 98.3°F | Ht 64.0 in | Wt 192.0 lb

## 2016-11-03 DIAGNOSIS — R030 Elevated blood-pressure reading, without diagnosis of hypertension: Secondary | ICD-10-CM | POA: Diagnosis not present

## 2016-11-03 DIAGNOSIS — N899 Noninflammatory disorder of vagina, unspecified: Secondary | ICD-10-CM | POA: Diagnosis not present

## 2016-11-03 DIAGNOSIS — N898 Other specified noninflammatory disorders of vagina: Secondary | ICD-10-CM

## 2016-11-03 NOTE — Progress Notes (Signed)
Pre visit review using our clinic review tool, if applicable. No additional management support is needed unless otherwise documented below in the visit note. 
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
## 2016-11-03 NOTE — Telephone Encounter (Signed)
-----   Message from Pleas Koch, NP sent at 10/20/2016  2:41 PM EDT ----- Regarding: BP Please check on patient's BP readings. What are they running?

## 2016-11-03 NOTE — Patient Instructions (Signed)
The bump is called a benign cyst.   For discomfort apply a warm compresses several times daily for 30 minute intervals.  You can also try an antiinflammatory such as ibuprofen. You may take 600 mg three times daily as needed.  Please notify me if you develop increased pain, swelling, fevers.   It was a pleasure to see you today!

## 2016-11-03 NOTE — Assessment & Plan Note (Signed)
Home blood pressure readings running 120 -130/80s.

## 2016-11-04 NOTE — Telephone Encounter (Signed)
This was already addressed on office visit on 11/03/2016.

## 2016-11-21 ENCOUNTER — Other Ambulatory Visit: Payer: Self-pay | Admitting: Primary Care

## 2016-11-21 DIAGNOSIS — E039 Hypothyroidism, unspecified: Secondary | ICD-10-CM

## 2016-11-26 ENCOUNTER — Other Ambulatory Visit (INDEPENDENT_AMBULATORY_CARE_PROVIDER_SITE_OTHER): Payer: 59

## 2016-11-26 DIAGNOSIS — E039 Hypothyroidism, unspecified: Secondary | ICD-10-CM

## 2016-11-26 LAB — TSH: TSH: 8.43 u[IU]/mL — ABNORMAL HIGH (ref 0.35–4.50)

## 2016-11-27 ENCOUNTER — Telehealth: Payer: Self-pay | Admitting: Primary Care

## 2016-11-27 DIAGNOSIS — E039 Hypothyroidism, unspecified: Secondary | ICD-10-CM

## 2016-11-27 MED ORDER — LEVOTHYROXINE SODIUM 150 MCG PO TABS: 150.0000 ug | ORAL_TABLET | Freq: Every day | ORAL | 3 refills | Status: DC

## 2016-11-27 NOTE — Telephone Encounter (Signed)
Patient saw her lab results on my chart.  Patient said she has been taking her thyroid medication every day.  Patient said if you need to call in a rx, please call rx to [facility]. Please send patient a message on my chart when rx is called in to pharmacy.

## 2016-11-27 NOTE — Telephone Encounter (Signed)
Spoken and notified patient of Kate's comments. Patient verbalized understanding.  Patient will call back later and schedule the lab appt.

## 2016-11-27 NOTE — Telephone Encounter (Signed)
Noted. Please notify patient i've increased her levothyroxine to 150 mcg. Please set her up for repeat TSH in 4 weeks. Thanks.

## 2016-12-16 ENCOUNTER — Telehealth: Payer: Self-pay

## 2016-12-16 NOTE — Telephone Encounter (Signed)
Pt last seen on 10/27/16 and Allie Bossier NP ordered screening mammo on 10/27/16; pt said [facility] is requesting diagnostic mammogram due to pt saying breast is sore and dense and on and off pt can feel small knot in rt breast. Allie Bossier NP out of office today.Please advise. Pt request cb when done.

## 2016-12-16 NOTE — Telephone Encounter (Signed)
I need to know which breast she is having a problem with so that I can order the ultrasound as well.

## 2016-12-16 NOTE — Telephone Encounter (Signed)
Per DPR, left detail message to call back.

## 2017-01-21 ENCOUNTER — Telehealth: Payer: Self-pay | Admitting: *Deleted

## 2017-01-21 NOTE — Telephone Encounter (Signed)
She is due now for repeat TSH, please get her set up for a lab only appointment. Also is she taking 150 mcg of levothyroxine?

## 2017-01-21 NOTE — Telephone Encounter (Signed)
PT sent in the following message. Please advise.  Appointment Request From: [name]    With Provider: Sheral Flow, NP [facility] [place]    Preferred Date Range: From 01/25/2017 To 01/26/2017    Preferred Times: Any    Reason for visit: Office Visit    Comments:  Hello Dr. Carlis Abbott,   I hope this email finds you doing well. I just wanted to know what I needed to do to schedule another TSH lab draw to see if my thyroid levels balanced out. If you could give me a call at my home number [phone] with this information it would be greatly appreciated.   Thank You,  Johnathan Hausen

## 2017-01-22 NOTE — Telephone Encounter (Signed)
LVM to schedule pt for lab only visit and ask about Levothyroxine.

## 2017-01-29 NOTE — Telephone Encounter (Signed)
Spoken to patient. She has been working 12 hours shift 4 days in a row.  Lab appt has been schedule for 02/02/2017. Yes, patient is taking 150 mcg of levothroid.

## 2017-01-29 NOTE — Telephone Encounter (Signed)
Noted  

## 2017-02-02 ENCOUNTER — Other Ambulatory Visit (INDEPENDENT_AMBULATORY_CARE_PROVIDER_SITE_OTHER): Payer: 59

## 2017-02-02 DIAGNOSIS — E039 Hypothyroidism, unspecified: Secondary | ICD-10-CM

## 2017-02-03 LAB — TSH: TSH: 3.27 u[IU]/mL (ref 0.35–4.50)

## 2017-02-19 IMAGING — CR DG CHEST 2V
2 series · 2 of 2 positions shown · non-contrast
Comparison: 10/29/2013.

CLINICAL DATA: Midsternal chest pain which began earlier today.

EXAM:
CHEST  2 VIEW

[chest pa]
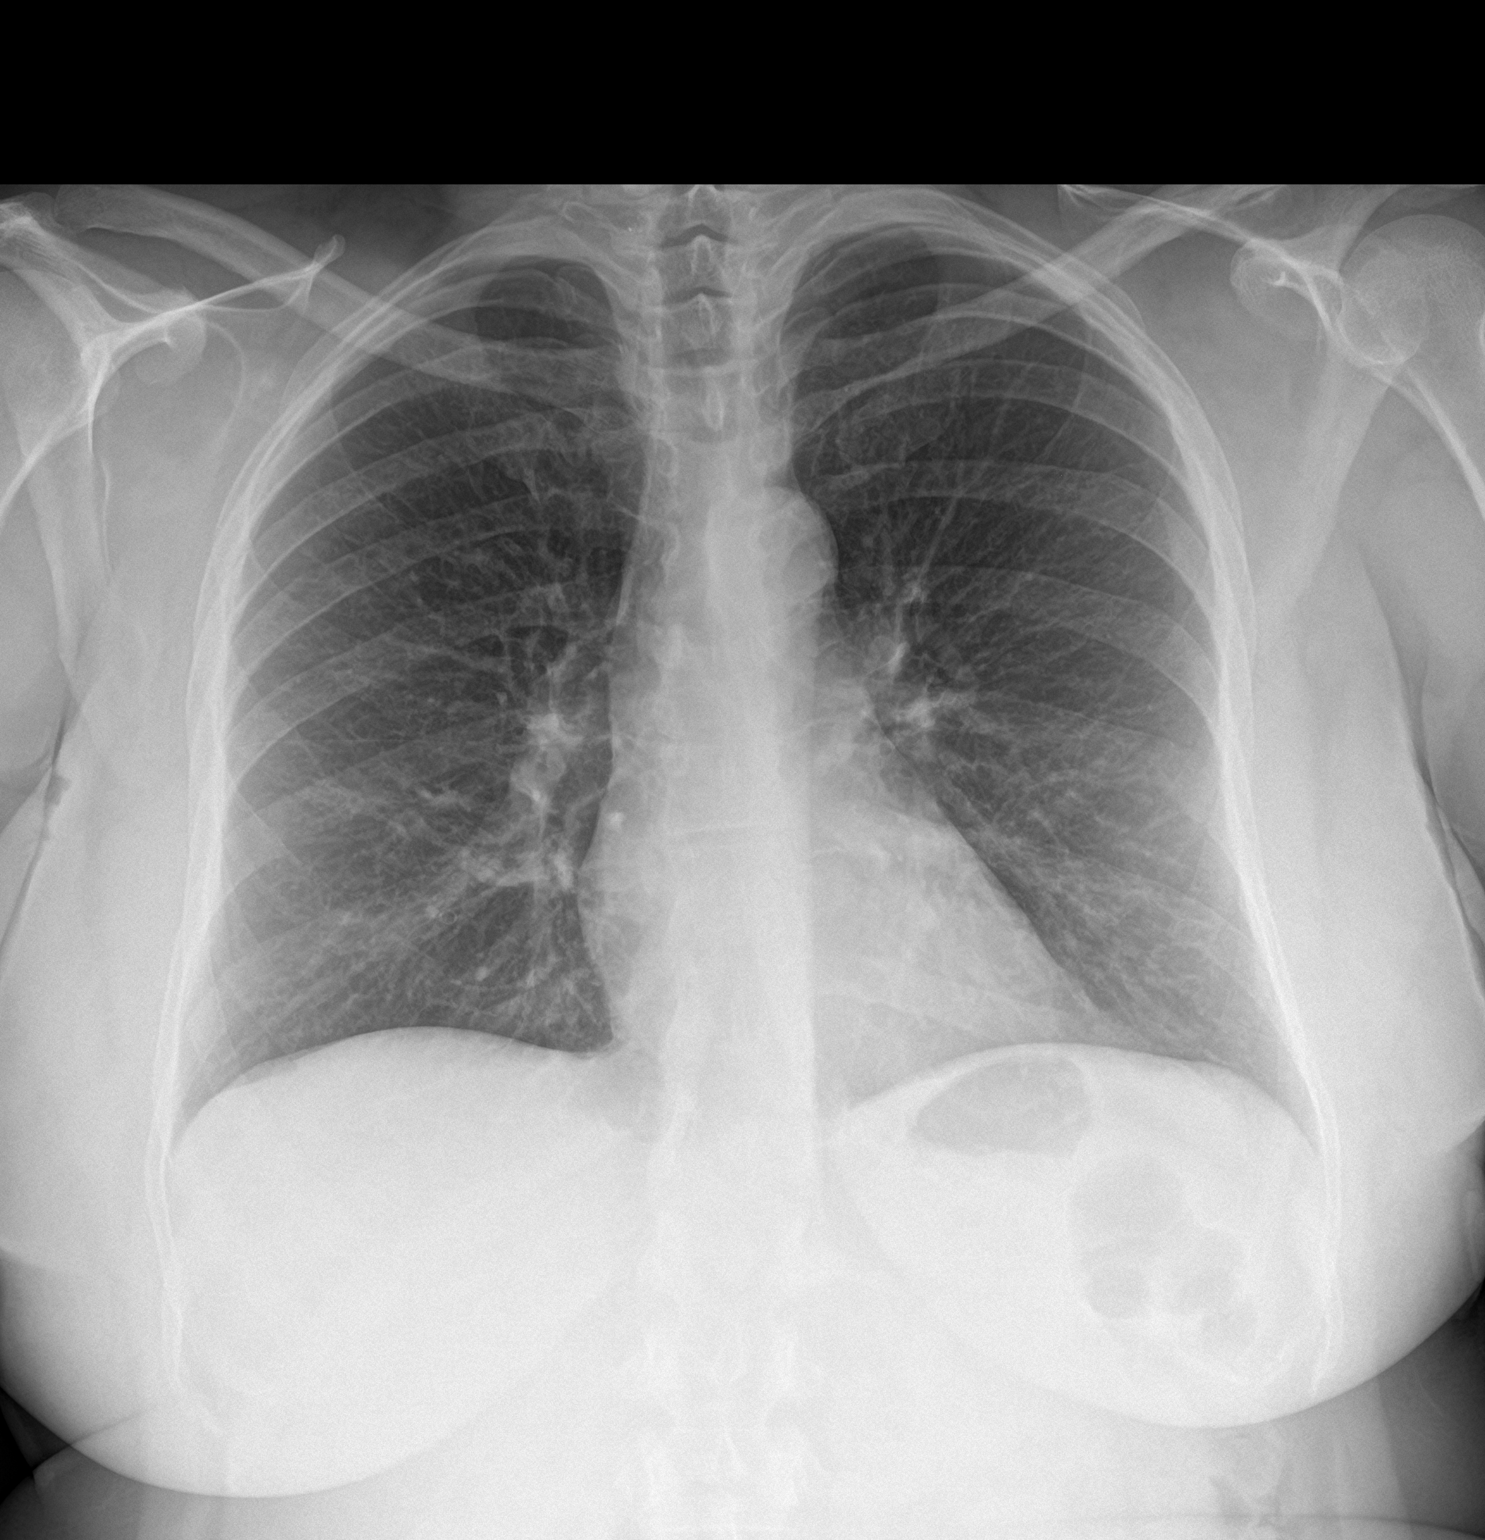

[chest lat]
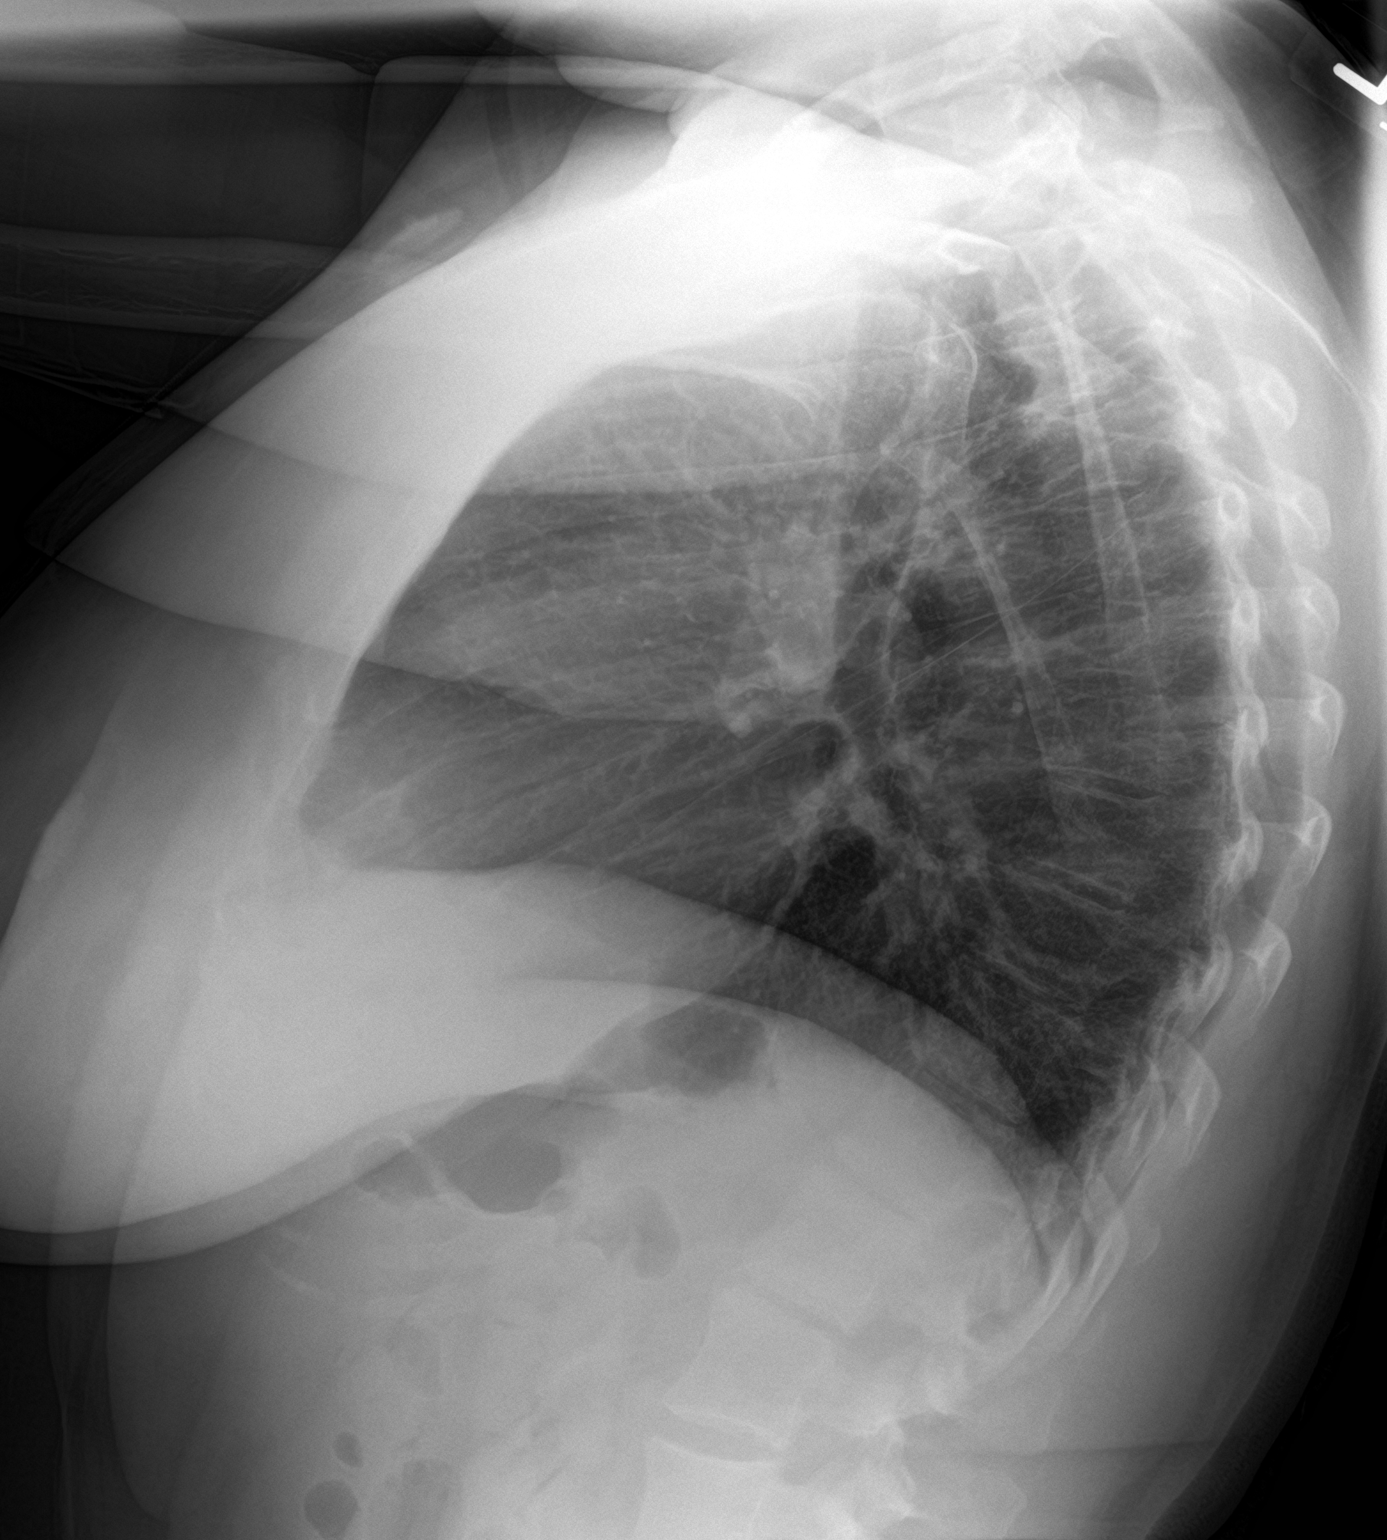

[2 of 2 positions shown; findings below may reference images not displayed]

FINDINGS: The heart size and mediastinal contours are within normal limits.
Both lungs are clear. The visualized skeletal structures are
unremarkable.
IMPRESSION: No active cardiopulmonary disease. No change from prior normal
radiograph.

## 2017-02-26 ENCOUNTER — Telehealth: Payer: Self-pay

## 2017-02-26 NOTE — Telephone Encounter (Signed)
Lisa Mullen with [facility] wants to know if can change manufacturers for levothyroxine. Lisa Mullen request cb.

## 2017-02-26 NOTE — Telephone Encounter (Signed)
Spoken to pharmacist and notified Kate's comments.

## 2017-02-26 NOTE — Telephone Encounter (Signed)
Okay to change manufacturers. Continue dose of 150 mcg.

## 2017-04-01 ENCOUNTER — Encounter: Payer: Self-pay | Admitting: Primary Care

## 2017-04-08 ENCOUNTER — Encounter: Payer: Self-pay | Admitting: Primary Care

## 2017-08-20 ENCOUNTER — Encounter (HOSPITAL_COMMUNITY): Payer: Self-pay

## 2017-08-20 ENCOUNTER — Telehealth: Payer: Self-pay | Admitting: Primary Care

## 2017-08-20 ENCOUNTER — Emergency Department (HOSPITAL_COMMUNITY): Payer: 59

## 2017-08-20 ENCOUNTER — Emergency Department (HOSPITAL_COMMUNITY)
Admission: EM | Admit: 2017-08-20 | Discharge: 2017-08-21 | Disposition: A | Discharge status: Home / Self Care | Payer: 59 | Attending: Emergency Medicine | Admitting: Emergency Medicine

## 2017-08-20 DIAGNOSIS — E039 Hypothyroidism, unspecified: Secondary | ICD-10-CM | POA: Insufficient documentation

## 2017-08-20 DIAGNOSIS — R0602 Shortness of breath: Secondary | ICD-10-CM | POA: Insufficient documentation

## 2017-08-20 DIAGNOSIS — Z79899 Other long term (current) drug therapy: Secondary | ICD-10-CM | POA: Diagnosis not present

## 2017-08-20 DIAGNOSIS — E038 Other specified hypothyroidism: Secondary | ICD-10-CM | POA: Insufficient documentation

## 2017-08-20 DIAGNOSIS — Z87891 Personal history of nicotine dependence: Secondary | ICD-10-CM | POA: Diagnosis not present

## 2017-08-20 DIAGNOSIS — R079 Chest pain, unspecified: Secondary | ICD-10-CM | POA: Insufficient documentation

## 2017-08-20 DIAGNOSIS — D571 Sickle-cell disease without crisis: Secondary | ICD-10-CM | POA: Diagnosis not present

## 2017-08-20 LAB — CBC
HCT: 36.5 % (ref 36.0–46.0)
Hemoglobin: 12.3 g/dL (ref 12.0–15.0)
MCH: 27.6 pg (ref 26.0–34.0)
MCHC: 33.7 g/dL (ref 30.0–36.0)
MCV: 82 fL (ref 78.0–100.0)
Platelets: 447 10*3/uL — ABNORMAL HIGH (ref 150–400)
RBC: 4.45 MIL/uL (ref 3.87–5.11)
RDW: 13.4 % (ref 11.5–15.5)
WBC: 6.2 10*3/uL (ref 4.0–10.5)

## 2017-08-20 LAB — BASIC METABOLIC PANEL
Anion gap: 9 (ref 5–15)
BUN: 12 mg/dL (ref 6–20)
CO2: 23 mmol/L (ref 22–32)
Calcium: 9.7 mg/dL (ref 8.9–10.3)
Chloride: 109 mmol/L (ref 101–111)
Creatinine, Ser: 0.79 mg/dL (ref 0.44–1.00)
GFR calc Af Amer: >60 mL/min (ref >60)
GFR calc non Af Amer: >60 mL/min (ref >60)
Glucose, Bld: 109 mg/dL — ABNORMAL HIGH (ref 65–99)
Potassium: 3.9 mmol/L (ref 3.5–5.1)
Sodium: 141 mmol/L (ref 135–145)

## 2017-08-20 LAB — TSH: TSH: 4.557 u[IU]/mL — ABNORMAL HIGH (ref 0.350–4.500)

## 2017-08-20 LAB — BRAIN NATRIURETIC PEPTIDE: B Natriuretic Peptide: 4 pg/mL (ref 0.0–100.0)

## 2017-08-20 LAB — I-STAT TROPONIN, ED: Troponin i, poc: 0 ng/mL (ref 0.00–0.08)

## 2017-08-20 LAB — D-DIMER, QUANTITATIVE: D-Dimer, Quant: 0.51 ug/mL-FEU — ABNORMAL HIGH (ref 0.00–0.50)

## 2017-08-20 MED ORDER — IBUPROFEN 400 MG PO TABS
400.0000 mg | ORAL_TABLET | Freq: Once | ORAL | Status: AC | PRN
  Administered 2017-08-20: 400 mg via ORAL

## 2017-08-20 MED ORDER — IBUPROFEN 400 MG PO TABS
ORAL_TABLET | ORAL | Status: AC
  Administered 2017-08-20: 400 mg via ORAL
  Filled 2017-08-20: qty 1

## 2017-08-20 NOTE — ED Notes (Signed)
Nurse starting IV and will draw labs. 

## 2017-08-20 NOTE — ED Triage Notes (Signed)
Per Pt, Pt is coming from dentist office where she was having her teeth clean. Pt reports that she started to have some left-sided chest pain and SOB. Pt called PCP and was told to come here. Hx of Sickle Cell

## 2017-08-20 NOTE — Telephone Encounter (Signed)
°  Patient Name: Lisa Mullen DOB: 1966/02/02 Initial Comment Caller states her BP is 203/107. Nurse Assessment Nurse: Cherie Dark RN, Jarrett Soho Date/Time (Eastern Time): 08/20/2017 1:19:25 PM Confirm and document reason for call. If symptomatic, describe symptoms. ---Caller states her blood pressure is high at 203/107. Her blood pressure got high in the dentist office and has been taking her BP every night at work. The last 3 weeks she has been having some difficulty breathing, get winded. Lowest her BP has been over the past few weeks is 157/97. Does the patient have any new or worsening symptoms? ---Yes Will a triage be completed? ---Yes Related visit to physician within the last 2 weeks? ---No Does the PT have any chronic conditions? (i.e. diabetes, asthma, etc.) ---Yes List chronic conditions. ---HTN, Is the patient pregnant or possibly pregnant? (Ask all females between the ages of 64-55) ---No Is this a behavioral health or substance abuse call? ---No Guidelines Guideline Title Affirmed Question Affirmed Notes High Blood Pressure [9] Systolic BP >= 357 OR Diastolic >= 017 AND [7] cardiac or neurologic symptoms (e.g., chest pain, difficulty breathing, unsteady gait, blurred vision) Final Disposition User Go to ED Now Cherie Dark, RN, [facility] - ED Disagree/Comply: Comply
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
## 2017-08-20 NOTE — ED Notes (Signed)
Pt reports being SOB and having some left sided chest pain. Pt reports not being able to get comfortable. Pt reports 1 episode of vomiting while coughing.

## 2017-08-20 NOTE — Telephone Encounter (Signed)
Per chart review tab pt seen at [facility] ED today.

## 2017-08-20 NOTE — Telephone Encounter (Signed)
Noted, patient in the emergency department at this time. Will await for ED notes.

## 2017-08-21 ENCOUNTER — Emergency Department (HOSPITAL_COMMUNITY): Payer: 59

## 2017-08-21 DIAGNOSIS — Z79899 Other long term (current) drug therapy: Secondary | ICD-10-CM | POA: Diagnosis not present

## 2017-08-21 DIAGNOSIS — R0600 Dyspnea, unspecified: Secondary | ICD-10-CM | POA: Diagnosis not present

## 2017-08-21 DIAGNOSIS — Z87891 Personal history of nicotine dependence: Secondary | ICD-10-CM | POA: Diagnosis not present

## 2017-08-21 DIAGNOSIS — R0602 Shortness of breath: Secondary | ICD-10-CM | POA: Diagnosis not present

## 2017-08-21 DIAGNOSIS — E038 Other specified hypothyroidism: Secondary | ICD-10-CM | POA: Diagnosis not present

## 2017-08-21 DIAGNOSIS — R079 Chest pain, unspecified: Secondary | ICD-10-CM | POA: Diagnosis not present

## 2017-08-21 DIAGNOSIS — E039 Hypothyroidism, unspecified: Secondary | ICD-10-CM | POA: Diagnosis not present

## 2017-08-21 DIAGNOSIS — D571 Sickle-cell disease without crisis: Secondary | ICD-10-CM | POA: Diagnosis not present

## 2017-08-21 MED ORDER — AEROCHAMBER PLUS FLO-VU LARGE MISC
Status: AC
  Administered 2017-08-21: 02:00:00
  Filled 2017-08-21: qty 1

## 2017-08-21 MED ORDER — ALBUTEROL SULFATE HFA 108 (90 BASE) MCG/ACT IN AERS
2.0000 | INHALATION_SPRAY | RESPIRATORY_TRACT | Status: DC | PRN
  Administered 2017-08-21: 2 via RESPIRATORY_TRACT
  Filled 2017-08-21: qty 6.7

## 2017-08-21 MED ORDER — IOPAMIDOL (ISOVUE-370) INJECTION 76%
INTRAVENOUS | Status: AC
  Administered 2017-08-21: 100 mL
  Filled 2017-08-21: qty 100

## 2017-08-21 NOTE — Discharge Instructions (Signed)
You have been evaluated for your trouble breathing.  Fortunately no life threatening condition were identified.  Your thyroid level is low, please follow up with your doctor for further management of your thyroid disease which may cause your shortness of breath and generalized fatigue. Use inhaler 2 puffs every 4 hrs as needed for shortness of breath.

## 2017-09-02 ENCOUNTER — Encounter: Payer: Self-pay | Admitting: Primary Care

## 2017-09-02 ENCOUNTER — Ambulatory Visit (INDEPENDENT_AMBULATORY_CARE_PROVIDER_SITE_OTHER): Payer: 59 | Admitting: Primary Care

## 2017-09-02 VITALS — BP 146/82 | HR 77 | Temp 98.0°F | Ht 64.0 in | Wt 194.0 lb

## 2017-09-02 DIAGNOSIS — I10 Essential (primary) hypertension: Secondary | ICD-10-CM

## 2017-09-02 DIAGNOSIS — E039 Hypothyroidism, unspecified: Secondary | ICD-10-CM | POA: Diagnosis not present

## 2017-09-02 DIAGNOSIS — J4530 Mild persistent asthma, uncomplicated: Secondary | ICD-10-CM | POA: Insufficient documentation

## 2017-09-02 MED ORDER — ALBUTEROL SULFATE HFA 108 (90 BASE) MCG/ACT IN AERS: 2.0000 | INHALATION_SPRAY | RESPIRATORY_TRACT | 0 refills | Status: DC | PRN

## 2017-09-02 MED ORDER — AMLODIPINE BESYLATE 10 MG PO TABS: 10.0000 mg | ORAL_TABLET | Freq: Every day | ORAL | 0 refills | Status: DC

## 2017-09-02 NOTE — Assessment & Plan Note (Signed)
Diagnosed in childhood, no problems as an adult.  Do believe symptoms secondary to asthma flare from exposure to flowers, also due to weather changes.  Discussed to notify if she continues to need albuterol more than three times weekly. Rx for albuterol sent to pharmacy. No wheezing on exam.

## 2017-09-02 NOTE — Assessment & Plan Note (Signed)
Numerous documented elevated readings.  Suspect hypertension to be the cause of some of her symptoms, will treat. Rx for Amlodipine 10 mg sent to pharmacy. Will have her continue to monitor BP, follow up with logs in 2 weeks.

## 2017-09-02 NOTE — Assessment & Plan Note (Signed)
Recent TSH of 4.5, do not suspect recent symptoms are related. Given normal TSH 6 months prior will not adjust levothyroxine at this time. Recheck TSH in 6 weeks to ensure stability. Continue levothyroxine 150 mcg.
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
## 2017-09-02 NOTE — Patient Instructions (Signed)
Start amlodipine 10 mg tablets for high blood pressure. Take 1 tablet by mouth once daily.  Continue to monitor your blood pressure as discussed.  Schedule a follow up visit in 2 weeks for re-evaluation.  It was a pleasure to see you today!   DASH Eating Plan DASH stands for "Dietary Approaches to Stop Hypertension." The DASH eating plan is a healthy eating plan that has been shown to reduce high blood pressure (hypertension). It may also reduce your risk for type 2 diabetes, heart disease, and stroke. The DASH eating plan may also help with weight loss. What are tips for following this plan? General guidelines  Avoid eating more than 2,300 mg (milligrams) of salt (sodium) a day. If you have hypertension, you may need to reduce your sodium intake to 1,500 mg a day.  Limit alcohol intake to no more than 1 drink a day for nonpregnant women and 2 drinks a day for men. One drink equals 12 oz of beer, 5 oz of wine, or 1 oz of hard liquor.  Work with your health care provider to maintain a healthy body weight or to lose weight. Ask what an ideal weight is for you.  Get at least 30 minutes of exercise that causes your heart to beat faster (aerobic exercise) most days of the week. Activities may include walking, swimming, or biking.  Work with your health care provider or diet and nutrition specialist (dietitian) to adjust your eating plan to your individual calorie needs. Reading food labels  Check food labels for the amount of sodium per serving. Choose foods with less than 5 percent of the Daily Value of sodium. Generally, foods with less than 300 mg of sodium per serving fit into this eating plan.  To find whole grains, look for the word "whole" as the first word in the ingredient list. Shopping  Buy products labeled as "low-sodium" or "no salt added."  Buy fresh foods. Avoid canned foods and premade or frozen meals. Cooking  Avoid adding salt when cooking. Use salt-free seasonings or  herbs instead of table salt or sea salt. Check with your health care provider or pharmacist before using salt substitutes.  Do not fry foods. Cook foods using healthy methods such as baking, boiling, grilling, and broiling instead.  Cook with heart-healthy oils, such as olive, canola, soybean, or sunflower oil. Meal planning   Eat a balanced diet that includes: ? 5 or more servings of fruits and vegetables each day. At each meal, try to fill half of your plate with fruits and vegetables. ? Up to 6-8 servings of whole grains each day. ? Less than 6 oz of lean meat, poultry, or fish each day. A 3-oz serving of meat is about the same size as a deck of cards. One egg equals 1 oz. ? 2 servings of low-fat dairy each day. ? A serving of nuts, seeds, or beans 5 times each week. ? Heart-healthy fats. Healthy fats called Omega-3 fatty acids are found in foods such as flaxseeds and coldwater fish, like sardines, salmon, and mackerel.  Limit how much you eat of the following: ? Canned or prepackaged foods. ? Food that is high in trans fat, such as fried foods. ? Food that is high in saturated fat, such as fatty meat. ? Sweets, desserts, sugary drinks, and other foods with added sugar. ? Full-fat dairy products.  Do not salt foods before eating.  Try to eat at least 2 vegetarian meals each week.  Eat more home-cooked  food and less restaurant, buffet, and fast food.  When eating at a restaurant, ask that your food be prepared with less salt or no salt, if possible. What foods are recommended? The items listed may not be a complete list. Talk with your dietitian about what dietary choices are best for you. Grains Whole-grain or whole-wheat bread. Whole-grain or whole-wheat pasta. Brown rice. [place]. Bulgur. Whole-grain and low-sodium cereals. Pita bread. Low-fat, low-sodium crackers. Whole-wheat flour tortillas. Vegetables Fresh or frozen vegetables (raw, steamed, roasted, or grilled).  Low-sodium or reduced-sodium tomato and vegetable juice. Low-sodium or reduced-sodium tomato sauce and tomato paste. Low-sodium or reduced-sodium canned vegetables. Fruits All fresh, dried, or frozen fruit. Canned fruit in natural juice (without added sugar). Meat and other protein foods Skinless chicken or [place]. Ground chicken or [place]. Pork with fat trimmed off. Fish and seafood. Egg whites. Dried beans, peas, or lentils. Unsalted nuts, nut butters, and seeds. Unsalted canned beans. Lean cuts of beef with fat trimmed off. Low-sodium, lean deli meat. Dairy Low-fat (1%) or fat-free (skim) milk. Fat-free, low-fat, or reduced-fat cheeses. Nonfat, low-sodium ricotta or cottage cheese. Low-fat or nonfat yogurt. Low-fat, low-sodium cheese. Fats and oils Soft margarine without trans fats. Vegetable oil. Low-fat, reduced-fat, or light mayonnaise and salad dressings (reduced-sodium). Canola, safflower, olive, soybean, and sunflower oils. Avocado. Seasoning and other foods Herbs. Spices. Seasoning mixes without salt. Unsalted popcorn and pretzels. Fat-free sweets. What foods are not recommended? The items listed may not be a complete list. Talk with your dietitian about what dietary choices are best for you. Grains Baked goods made with fat, such as croissants, muffins, or some breads. Dry pasta or rice meal packs. Vegetables Creamed or fried vegetables. Vegetables in a cheese sauce. Regular canned vegetables (not low-sodium or reduced-sodium). Regular canned tomato sauce and paste (not low-sodium or reduced-sodium). Regular tomato and vegetable juice (not low-sodium or reduced-sodium). Angie Fava. Olives. Fruits Canned fruit in a light or heavy syrup. Fried fruit. Fruit in cream or butter sauce. Meat and other protein foods Fatty cuts of meat. Ribs. Fried meat. Berniece Salines. Sausage. Bologna and other processed lunch meats. Salami. Fatback. Hotdogs. Bratwurst. Salted nuts and seeds. Canned beans with added  salt. Canned or smoked fish. Whole eggs or egg yolks. Chicken or [place] with skin. Dairy Whole or 2% milk, cream, and half-and-half. Whole or full-fat cream cheese. Whole-fat or sweetened yogurt. Full-fat cheese. Nondairy creamers. Whipped toppings. Processed cheese and cheese spreads. Fats and oils Butter. Stick margarine. Lard. Shortening. Ghee. Bacon fat. Tropical oils, such as coconut, palm kernel, or palm oil. Seasoning and other foods Salted popcorn and pretzels. Onion salt, garlic salt, seasoned salt, table salt, and sea salt. Worcestershire sauce. Tartar sauce. Barbecue sauce. Teriyaki sauce. Soy sauce, including reduced-sodium. Steak sauce. Canned and packaged gravies. Fish sauce. Oyster sauce. Cocktail sauce. Horseradish that you find on the shelf. Ketchup. Mustard. Meat flavorings and tenderizers. Bouillon cubes. Hot sauce and Tabasco sauce. Premade or packaged marinades. Premade or packaged taco seasonings. Relishes. Regular salad dressings. Where to find more information:  National Heart, Lung, and [place]: https://wilson-eaton.com/  American Heart Association: www.heart.org Summary  The DASH eating plan is a healthy eating plan that has been shown to reduce high blood pressure (hypertension). It may also reduce your risk for type 2 diabetes, heart disease, and stroke.  With the DASH eating plan, you should limit salt (sodium) intake to 2,300 mg a day. If you have hypertension, you may need to reduce your sodium intake to 1,500 mg  a day.  When on the DASH eating plan, aim to eat more fresh fruits and vegetables, whole grains, lean proteins, low-fat dairy, and heart-healthy fats.  Work with your health care provider or diet and nutrition specialist (dietitian) to adjust your eating plan to your individual calorie needs. This information is not intended to replace advice given to you by your health care provider. Make sure you discuss any questions you have with your health care  provider. Document Released: 11/26/2011 Document Revised: 11/30/2016 Document Reviewed: 11/30/2016 Elsevier Interactive Patient Education  2017 [employer].

## 2017-09-14 IMAGING — CR DG CHEST 2V
1 series · 2 of 2 positions shown · non-contrast
Comparison: 12/22/2015

CLINICAL DATA: Chest pain

EXAM:
CHEST  2 VIEW

[Series 1: w chest pa · 0.14mm/px · 2 of 2 slices shown]
[im 1/2]
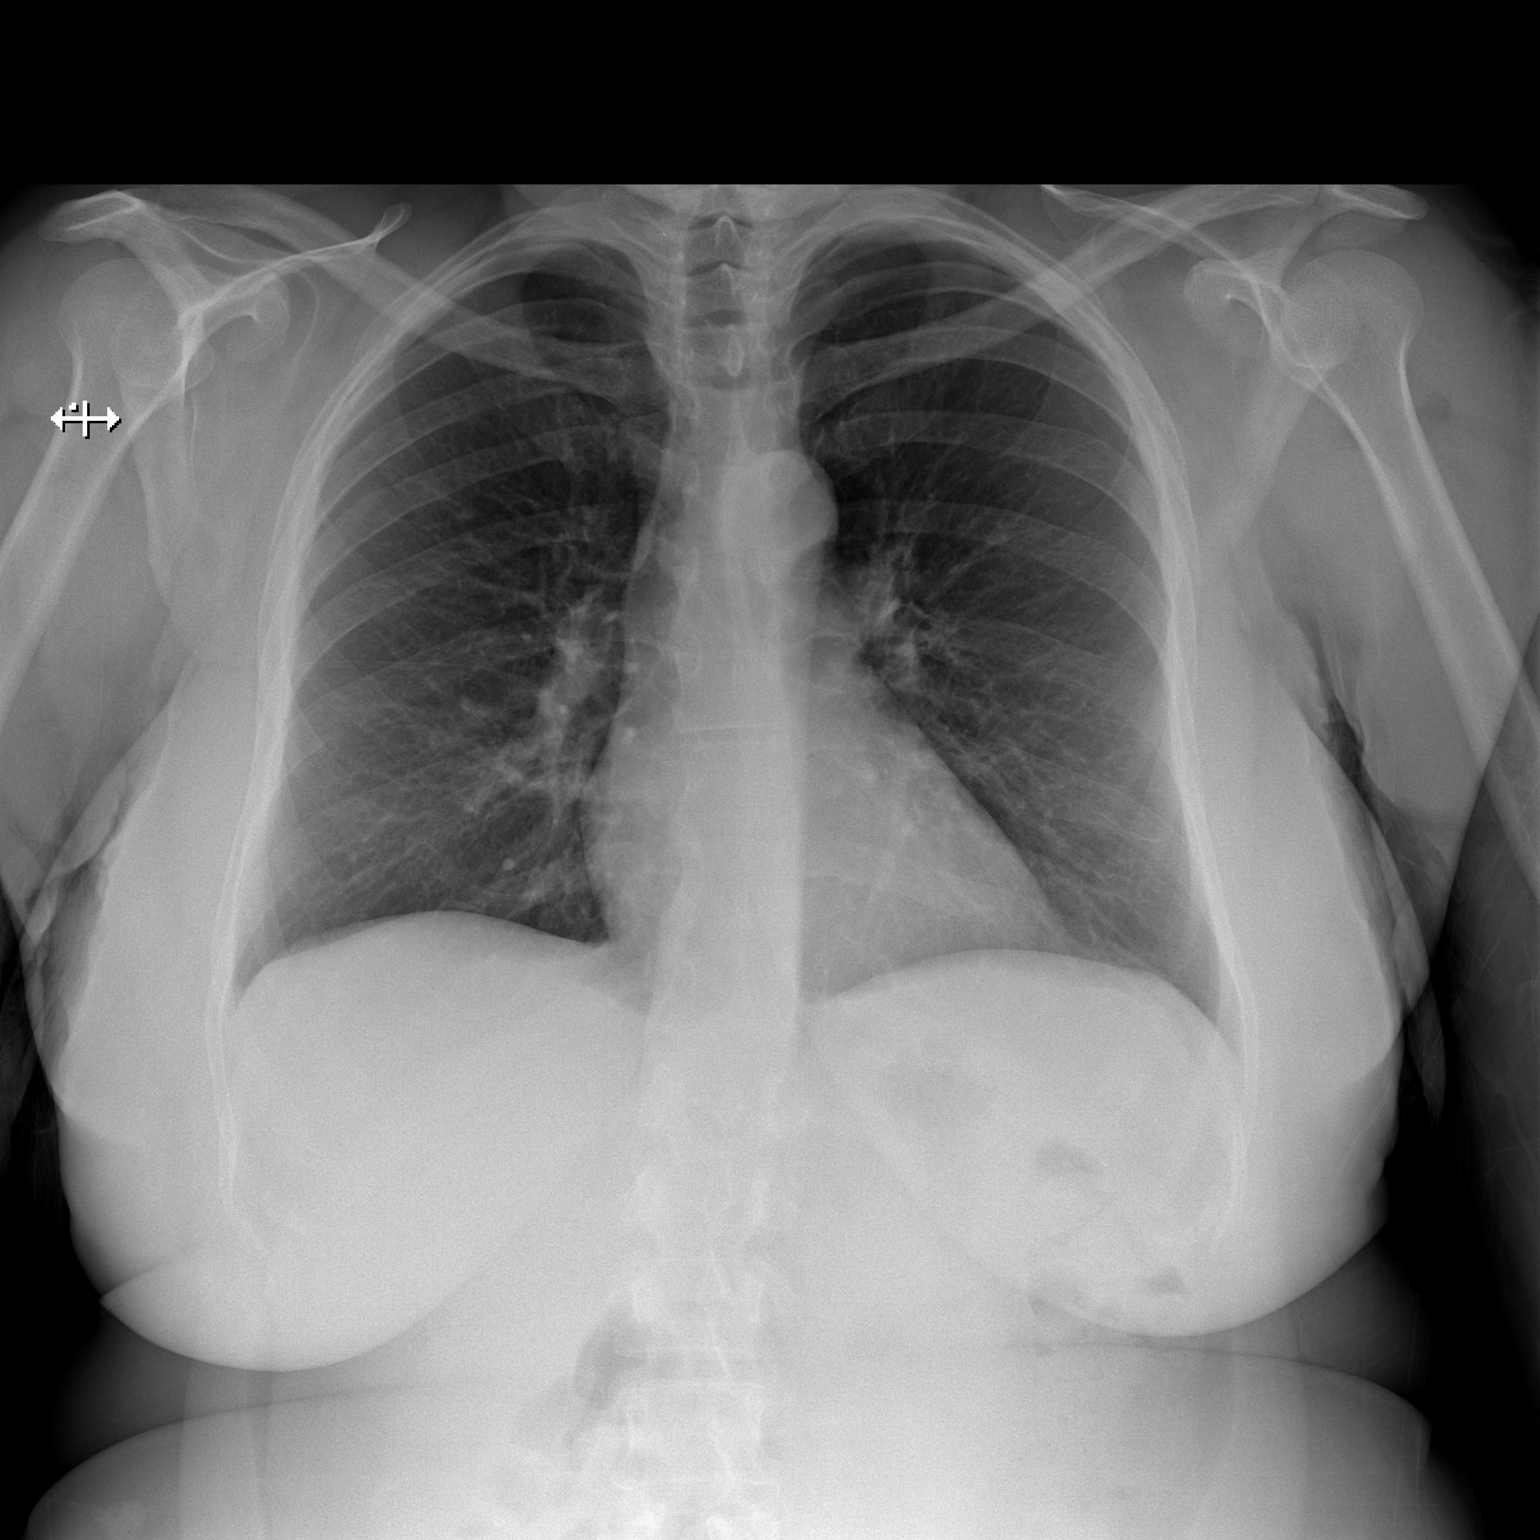
[im 2/2]
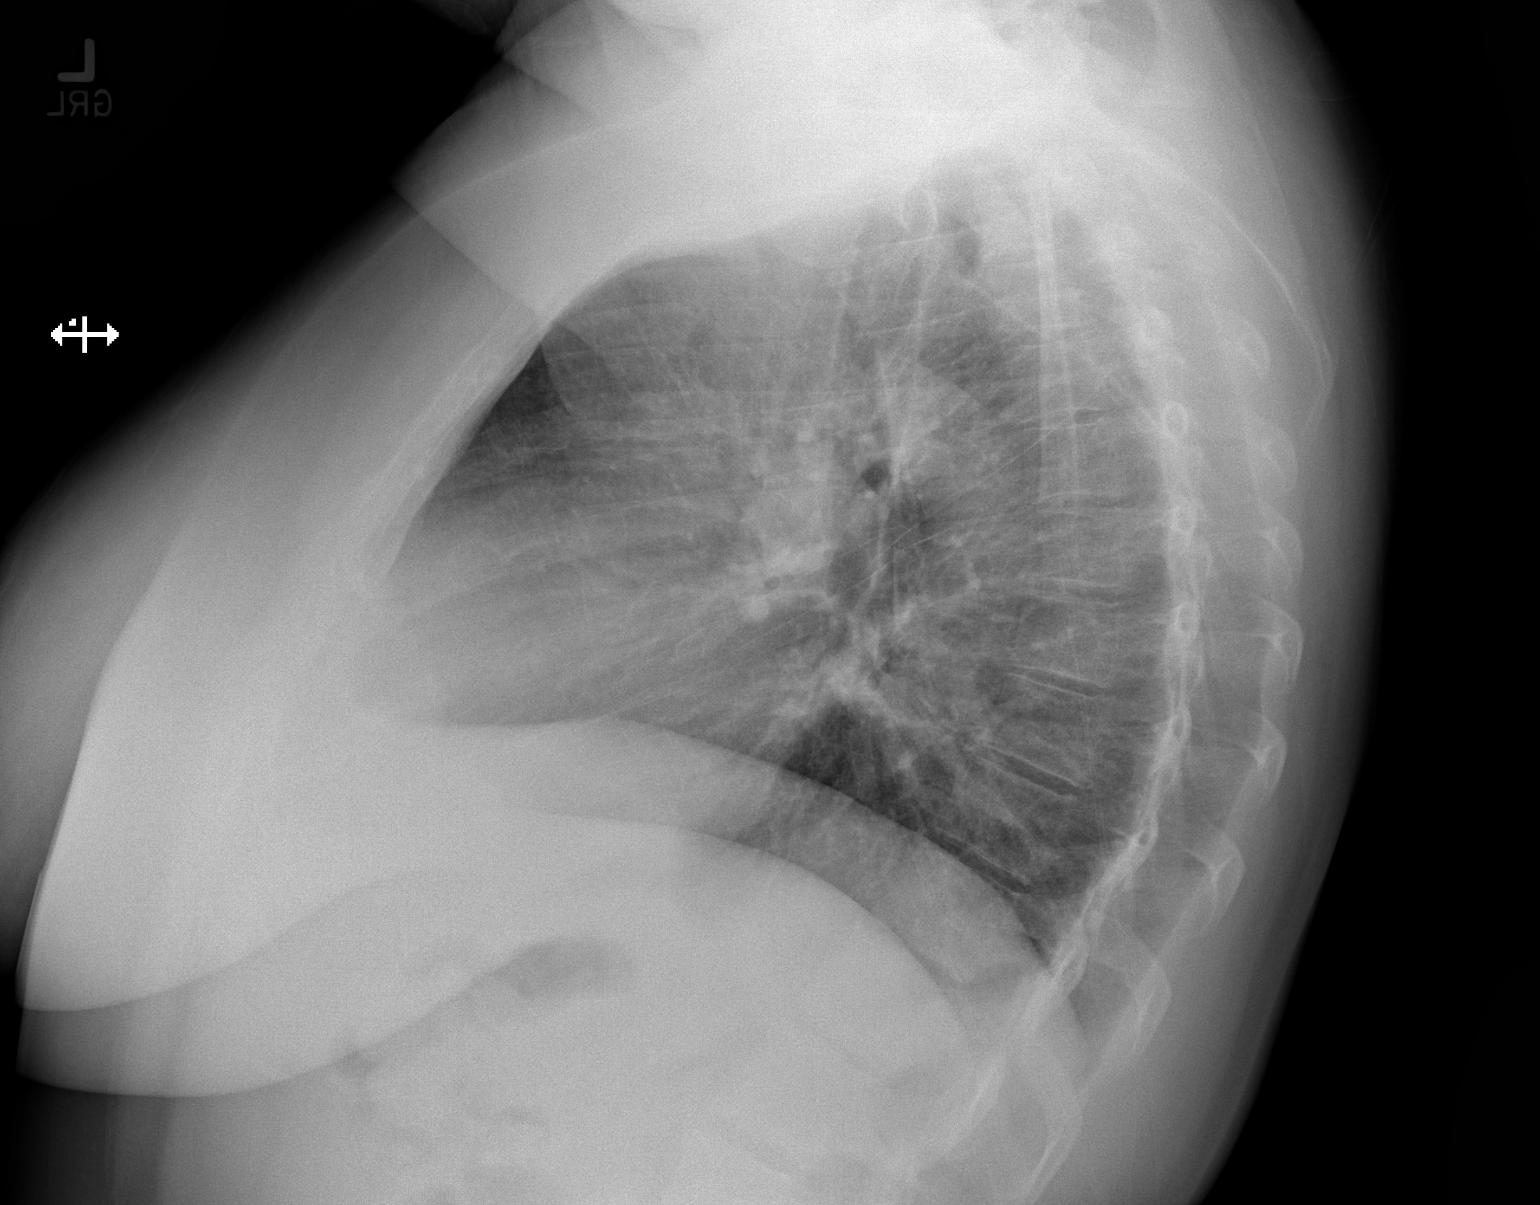

[2 of 2 positions shown; findings below may reference images not displayed]

FINDINGS: The heart size and mediastinal contours are within normal limits.
Both lungs are clear. The visualized skeletal structures are
unremarkable.
IMPRESSION: No active cardiopulmonary disease.

## 2017-09-28 ENCOUNTER — Ambulatory Visit (INDEPENDENT_AMBULATORY_CARE_PROVIDER_SITE_OTHER): Payer: 59 | Admitting: Primary Care

## 2017-09-28 ENCOUNTER — Encounter: Payer: Self-pay | Admitting: Primary Care

## 2017-09-28 VITALS — BP 142/82 | HR 77 | Temp 98.2°F | Ht 64.0 in | Wt 192.8 lb

## 2017-09-28 DIAGNOSIS — I10 Essential (primary) hypertension: Secondary | ICD-10-CM | POA: Diagnosis not present

## 2017-09-28 DIAGNOSIS — E039 Hypothyroidism, unspecified: Secondary | ICD-10-CM | POA: Diagnosis not present

## 2017-09-28 MED ORDER — AMLODIPINE-OLMESARTAN 10-20 MG PO TABS: 1.0000 | ORAL_TABLET | Freq: Every day | ORAL | 0 refills | Status: DC

## 2017-09-28 NOTE — Patient Instructions (Addendum)
Start amlodipine-olmesartan 10-20 mg tablets for high blood pressure. Take 1 tablet by mouth once daily.  Stop amlodipine 10 mg tablets.   Check your blood pressure daily, around the same time of day, for the next 2-3 weeks.  Ensure that you have rested for 30 minutes prior to checking your blood pressure. Record your readings and bring them to your next visit.  Schedule a follow up visit in 2-3 weeks to recheck your blood pressure.  It was a pleasure to see you today!
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
## 2017-09-28 NOTE — Assessment & Plan Note (Signed)
Above goal in the office today, also on home readings. Will change Amlodipine to amlodipine-olmesartan. Follow up in the office in 2-3 weeks for BP check and lab work.

## 2017-09-28 NOTE — Assessment & Plan Note (Signed)
Recheck TSH during next visit in 2-3 weeks.

## 2018-01-15 ENCOUNTER — Other Ambulatory Visit: Payer: Self-pay | Admitting: Primary Care

## 2018-01-15 DIAGNOSIS — E039 Hypothyroidism, unspecified: Secondary | ICD-10-CM

## 2018-01-17 ENCOUNTER — Other Ambulatory Visit: Payer: Self-pay | Admitting: Primary Care

## 2018-01-17 DIAGNOSIS — I10 Essential (primary) hypertension: Secondary | ICD-10-CM

## 2018-01-17 DIAGNOSIS — E039 Hypothyroidism, unspecified: Secondary | ICD-10-CM

## 2018-01-17 MED ORDER — AMLODIPINE-OLMESARTAN 10-20 MG PO TABS: 1.0000 | ORAL_TABLET | Freq: Every day | ORAL | 0 refills | Status: DC

## 2018-01-21 ENCOUNTER — Other Ambulatory Visit: Payer: Self-pay | Admitting: Primary Care

## 2018-01-21 DIAGNOSIS — E039 Hypothyroidism, unspecified: Secondary | ICD-10-CM

## 2018-02-15 ENCOUNTER — Encounter: Payer: Self-pay | Admitting: Primary Care

## 2018-02-15 ENCOUNTER — Ambulatory Visit (INDEPENDENT_AMBULATORY_CARE_PROVIDER_SITE_OTHER): Payer: 59 | Admitting: Primary Care

## 2018-02-15 VITALS — BP 98/62 | HR 87 | Temp 98.4°F | Ht 64.0 in | Wt 194.8 lb

## 2018-02-15 DIAGNOSIS — R5383 Other fatigue: Secondary | ICD-10-CM | POA: Diagnosis not present

## 2018-02-15 DIAGNOSIS — E559 Vitamin D deficiency, unspecified: Secondary | ICD-10-CM | POA: Diagnosis not present

## 2018-02-15 DIAGNOSIS — D696 Thrombocytopenia, unspecified: Secondary | ICD-10-CM | POA: Diagnosis not present

## 2018-02-15 DIAGNOSIS — I10 Essential (primary) hypertension: Secondary | ICD-10-CM

## 2018-02-15 DIAGNOSIS — Z1239 Encounter for other screening for malignant neoplasm of breast: Secondary | ICD-10-CM

## 2018-02-15 DIAGNOSIS — Z0000 Encounter for general adult medical examination without abnormal findings: Secondary | ICD-10-CM | POA: Diagnosis not present

## 2018-02-15 DIAGNOSIS — E039 Hypothyroidism, unspecified: Secondary | ICD-10-CM

## 2018-02-15 DIAGNOSIS — Z1231 Encounter for screening mammogram for malignant neoplasm of breast: Secondary | ICD-10-CM

## 2018-02-15 DIAGNOSIS — J4530 Mild persistent asthma, uncomplicated: Secondary | ICD-10-CM | POA: Diagnosis not present

## 2018-02-15 DIAGNOSIS — Z1211 Encounter for screening for malignant neoplasm of colon: Secondary | ICD-10-CM | POA: Diagnosis not present

## 2018-02-15 NOTE — Assessment & Plan Note (Signed)
Stable in the office today, continue [place].  BMP pending.

## 2018-02-15 NOTE — Assessment & Plan Note (Signed)
Infrequent use of albuterol inhaler. Only uses when around certain flowers.

## 2018-02-15 NOTE — Patient Instructions (Signed)
Stop by the lab prior to leaving today. I will notify you of your results once received.   Call the [facility] in [place] to schedule your mammogram.  You will be contacted regarding your referral to GI for the colonoscopy.  Please let us know if you have not been contacted within one week.   Increase consumption of vegetables, fruit, whole grains, lean protein.  Start exercising. You should be getting 150 minutes of moderate intensity exercise weekly.  It was a pleasure to see you today!
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
## 2018-02-15 NOTE — Assessment & Plan Note (Signed)
Immunizations UTD. Colonoscopy due this Summer, referral placed to GI. Mammogram overdue, orders placed. Discussed the importance of a healthy diet and regular exercise in order for weight loss, and to reduce the risk of any potential medical problems. Exam unremarkable. Labs pending. Follow up in 1 year.

## 2018-02-15 NOTE — Assessment & Plan Note (Signed)
Compliant to levothyroxine, following administration instructions. Repeat TSH pending.

## 2018-02-15 NOTE — Assessment & Plan Note (Signed)
Continues to experience fatigue. Check CBC, TSH, vitamin D.

## 2018-02-15 NOTE — Assessment & Plan Note (Signed)
Noted on last several CBC results. CBC pending today.

## 2018-02-16 ENCOUNTER — Other Ambulatory Visit: Payer: Self-pay | Admitting: Primary Care

## 2018-02-16 DIAGNOSIS — E559 Vitamin D deficiency, unspecified: Secondary | ICD-10-CM

## 2018-02-16 DIAGNOSIS — E039 Hypothyroidism, unspecified: Secondary | ICD-10-CM

## 2018-02-16 LAB — COMPREHENSIVE METABOLIC PANEL
ALT: 20 U/L (ref 0–35)
AST: 18 U/L (ref 0–37)
Albumin: 4 g/dL (ref 3.5–5.2)
Alkaline Phosphatase: 61 U/L (ref 39–117)
BUN: 9 mg/dL (ref 6–23)
CO2: 26 mEq/L (ref 19–32)
Calcium: 9.6 mg/dL (ref 8.4–10.5)
Chloride: 105 mEq/L (ref 96–112)
Creatinine, Ser: 0.78 mg/dL (ref 0.40–1.20)
GFR: 100.67 mL/min (ref >60.00)
Glucose, Bld: 84 mg/dL (ref 70–99)
Potassium: 3.7 mEq/L (ref 3.5–5.1)
Sodium: 138 mEq/L (ref 135–145)
Total Bilirubin: 0.4 mg/dL (ref 0.2–1.2)
Total Protein: 7.2 g/dL (ref 6.0–8.3)

## 2018-02-16 LAB — VITAMIN D 25 HYDROXY (VIT D DEFICIENCY, FRACTURES): VITD: 6.28 ng/mL — ABNORMAL LOW (ref 30.00–100.00)

## 2018-02-16 LAB — LIPID PANEL
Cholesterol: 180 mg/dL (ref 0–200)
HDL: 49 mg/dL (ref >39.00)
LDL Cholesterol: 118 mg/dL — ABNORMAL HIGH (ref 0–99)
NonHDL: 131.01
Total CHOL/HDL Ratio: 4
Triglycerides: 65 mg/dL (ref 0.0–149.0)
VLDL: 13 mg/dL (ref 0.0–40.0)

## 2018-02-16 LAB — CBC
HCT: 36 % (ref 36.0–46.0)
Hemoglobin: 11.7 g/dL — ABNORMAL LOW (ref 12.0–15.0)
MCHC: 32.5 g/dL (ref 30.0–36.0)
MCV: 83.9 fl (ref 78.0–100.0)
Platelets: 432 10*3/uL — ABNORMAL HIGH (ref 150.0–400.0)
RBC: 4.29 Mil/uL (ref 3.87–5.11)
RDW: 13.8 % (ref 11.5–15.5)
WBC: 6.6 10*3/uL (ref 4.0–10.5)

## 2018-02-16 LAB — TSH: TSH: 2.81 u[IU]/mL (ref 0.35–4.50)

## 2018-02-16 LAB — HEMOGLOBIN A1C: Hgb A1c MFr Bld: 5.5 % (ref 4.6–6.5)

## 2018-02-16 MED ORDER — LEVOTHYROXINE SODIUM 150 MCG PO TABS: ORAL_TABLET | ORAL | 3 refills | Status: DC

## 2018-02-16 MED ORDER — VITAMIN D (ERGOCALCIFEROL) 1.25 MG (50000 UNIT) PO CAPS: ORAL_CAPSULE | ORAL | 0 refills | Status: DC

## 2018-03-01 ENCOUNTER — Telehealth: Payer: Self-pay | Admitting: Primary Care

## 2018-03-01 DIAGNOSIS — E039 Hypothyroidism, unspecified: Secondary | ICD-10-CM

## 2018-03-01 MED ORDER — LEVOTHYROXINE SODIUM 150 MCG PO TABS: ORAL_TABLET | ORAL | 3 refills | Status: DC

## 2018-03-01 NOTE — Telephone Encounter (Signed)
Copied from [facility] [id number]. Topic: Quick Communication - Rx Refill/Question >> Mar 01, 2018 12:33 PM Oliver Pila B wrote: Medication:  levothyroxine (SYNTHROID, LEVOTHROID) 150 MCG tablet [[id number]   Has the patient contacted their pharmacy? Yes.     (Agent: If no, request that the patient contact the pharmacy for the refill.)   Preferred Pharmacy (with phone number or street name): [facility]   Agent: Please be advised that RX refills may take up to 3 business days. We ask that you follow-up with your pharmacy.

## 2018-03-01 NOTE — Telephone Encounter (Signed)
Per pt. Request, Rx for Levothyroxine 150 mcg.  sent to [facility].  Called the [facility] @ [address] and cx'd the prescription there.  Notified pt. Per voice message that her request was taken care of.

## 2018-04-12 ENCOUNTER — Ambulatory Visit
Admission: RE | Admit: 2018-04-12 | Discharge: 2018-04-12 | Disposition: A | Discharge status: Home / Self Care | Payer: 59 | Source: Ambulatory Visit | Attending: Primary Care | Admitting: Primary Care

## 2018-04-12 DIAGNOSIS — Z1231 Encounter for screening mammogram for malignant neoplasm of breast: Secondary | ICD-10-CM | POA: Diagnosis not present

## 2018-04-12 DIAGNOSIS — Z1239 Encounter for other screening for malignant neoplasm of breast: Secondary | ICD-10-CM

## 2018-05-18 ENCOUNTER — Encounter: Payer: Self-pay | Admitting: Primary Care

## 2018-07-26 ENCOUNTER — Encounter: Payer: Self-pay | Admitting: Primary Care

## 2018-07-28 ENCOUNTER — Other Ambulatory Visit: Payer: Self-pay

## 2018-07-28 ENCOUNTER — Other Ambulatory Visit: Payer: Self-pay | Admitting: Primary Care

## 2018-07-28 DIAGNOSIS — E559 Vitamin D deficiency, unspecified: Secondary | ICD-10-CM

## 2018-07-28 DIAGNOSIS — I10 Essential (primary) hypertension: Secondary | ICD-10-CM

## 2018-07-29 ENCOUNTER — Ambulatory Visit (INDEPENDENT_AMBULATORY_CARE_PROVIDER_SITE_OTHER): Payer: 59 | Admitting: Primary Care

## 2018-07-29 ENCOUNTER — Encounter: Payer: Self-pay | Admitting: Primary Care

## 2018-07-29 VITALS — BP 130/78 | HR 78 | Temp 98.1°F | Ht 64.0 in | Wt 188.0 lb

## 2018-07-29 DIAGNOSIS — B379 Candidiasis, unspecified: Secondary | ICD-10-CM

## 2018-07-29 DIAGNOSIS — I10 Essential (primary) hypertension: Secondary | ICD-10-CM | POA: Diagnosis not present

## 2018-07-29 DIAGNOSIS — G4733 Obstructive sleep apnea (adult) (pediatric): Secondary | ICD-10-CM | POA: Insufficient documentation

## 2018-07-29 DIAGNOSIS — R0683 Snoring: Secondary | ICD-10-CM

## 2018-07-29 DIAGNOSIS — R350 Frequency of micturition: Secondary | ICD-10-CM | POA: Diagnosis not present

## 2018-07-29 DIAGNOSIS — N3001 Acute cystitis with hematuria: Secondary | ICD-10-CM

## 2018-07-29 DIAGNOSIS — T3695XA Adverse effect of unspecified systemic antibiotic, initial encounter: Secondary | ICD-10-CM | POA: Diagnosis not present

## 2018-07-29 LAB — POC URINALSYSI DIPSTICK (AUTOMATED)
Bilirubin, UA: NEGATIVE
Glucose, UA: NEGATIVE
Ketones, UA: NEGATIVE
Nitrite, UA: POSITIVE
Protein, UA: POSITIVE — AB
Spec Grav, UA: 1.02 (ref 1.010–1.025)
Urobilinogen, UA: 0.2 E.U./dL
pH, UA: 6 (ref 5.0–8.0)

## 2018-07-29 MED ORDER — SULFAMETHOXAZOLE-TRIMETHOPRIM 800-160 MG PO TABS: 1.0000 | ORAL_TABLET | Freq: Two times a day (BID) | ORAL | 0 refills | Status: DC

## 2018-07-29 MED ORDER — FLUCONAZOLE 150 MG PO TABS: 150.0000 mg | ORAL_TABLET | Freq: Once | ORAL | 0 refills | Status: AC

## 2018-07-29 MED FILL — SULFAMETHOXAZOLE-TMP DS TAB: 800-160 | 5 days supply | Qty: 10 | Fill #0

## 2018-07-29 MED FILL — FLUCONAZOLE 150 MG TABS: 150 | 1 days supply | Qty: 1 | Fill #0

## 2018-07-29 NOTE — Patient Instructions (Addendum)
Start Bactrim DS (sulfamethoxazole/trimethoprim) tablets for urinary tract infection. Take 1 tablet by mouth twice daily for 5 days.  Take the fluconazole tablet after you complete the antibiotics.  Ensure you are consuming 64 ounces of water daily.  It was a pleasure to see you today!
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
## 2018-07-29 NOTE — Assessment & Plan Note (Signed)
Suspicious for sleep apnea. Referral to pulmonology placed.

## 2018-07-31 ENCOUNTER — Other Ambulatory Visit: Payer: Self-pay | Admitting: Primary Care

## 2018-07-31 DIAGNOSIS — N3001 Acute cystitis with hematuria: Secondary | ICD-10-CM

## 2018-07-31 LAB — URINE CULTURE
MICRO NUMBER:: 90945915
SPECIMEN QUALITY:: ADEQUATE

## 2018-07-31 MED ORDER — CEPHALEXIN 500 MG PO CAPS: 500.0000 mg | ORAL_CAPSULE | Freq: Two times a day (BID) | ORAL | 0 refills | Status: AC

## 2018-08-01 MED FILL — CEPHALEXIN 500 MG CAPSULE: 500 | 7 days supply | Qty: 14 | Fill #0

## 2018-08-11 ENCOUNTER — Ambulatory Visit (INDEPENDENT_AMBULATORY_CARE_PROVIDER_SITE_OTHER): Payer: 59 | Admitting: Internal Medicine

## 2018-08-11 ENCOUNTER — Encounter: Payer: Self-pay | Admitting: Internal Medicine

## 2018-08-11 VITALS — BP 128/82 | HR 71 | Resp 16 | Ht 64.0 in | Wt 192.0 lb

## 2018-08-11 DIAGNOSIS — G4719 Other hypersomnia: Secondary | ICD-10-CM

## 2018-08-11 DIAGNOSIS — R0683 Snoring: Secondary | ICD-10-CM

## 2018-08-11 NOTE — Patient Instructions (Signed)
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
## 2018-08-26 ENCOUNTER — Telehealth: Payer: Self-pay | Admitting: Internal Medicine

## 2018-08-26 NOTE — Telephone Encounter (Signed)
Lmov for patient to call back  Change in schedule for Dr Ashby Dawes on 10/20/18 Need to reschedule  Will try again at a later time

## 2018-08-29 NOTE — Telephone Encounter (Signed)
lmov

## 2018-08-30 NOTE — Telephone Encounter (Signed)
Lmov for patient to call and reschedule

## 2018-09-02 ENCOUNTER — Encounter: Payer: Self-pay | Admitting: Internal Medicine

## 2018-09-02 NOTE — Telephone Encounter (Signed)
Mailed Letter. Cancelled appt. 10/31. Will wait patient call to reschedule.

## 2018-10-10 ENCOUNTER — Telehealth: Payer: 59 | Admitting: Physician Assistant

## 2018-10-10 DIAGNOSIS — H1013 Acute atopic conjunctivitis, bilateral: Secondary | ICD-10-CM | POA: Diagnosis not present

## 2018-10-10 DIAGNOSIS — J309 Allergic rhinitis, unspecified: Secondary | ICD-10-CM | POA: Diagnosis not present

## 2018-10-10 DIAGNOSIS — J45901 Unspecified asthma with (acute) exacerbation: Secondary | ICD-10-CM | POA: Diagnosis not present

## 2018-10-10 MED ORDER — FLUTICASONE PROPIONATE 50 MCG/ACT NA SUSP: 2.0000 | Freq: Every day | NASAL | 0 refills | Status: DC

## 2018-10-10 MED ORDER — PREDNISONE 10 MG PO TABS: 10.0000 mg | ORAL_TABLET | ORAL | 0 refills | Status: AC

## 2018-10-10 MED ORDER — OLOPATADINE HCL 0.2 % OP SOLN: 1.0000 [drp] | Freq: Every day | OPHTHALMIC | 0 refills | Status: DC

## 2018-10-10 MED FILL — OLOPATADINE HCL 0.2% EYE DR: 0.2 | 25 days supply | Qty: 3 | Fill #0

## 2018-10-10 MED FILL — FLUTICASONE PROP 50 MCG SPR: 50 | 30 days supply | Qty: 16 | Fill #0

## 2018-10-10 MED FILL — predniSONE 10 MG (21) TBPK: 10 | 6 days supply | Qty: 21 | Fill #0

## 2018-10-10 NOTE — Progress Notes (Signed)
E visit for Allergic Rhinitis We are sorry that you are not feeling well.  Her is how we plan to help!  Based on what you have shared with me it looks like you have Allergic Rhinitis.  Rhinitis is when a reaction occurs that causes nasal congestion, runny nose, sneezing, and itching.  Most types of rhinitis are caused by an inflammation and are associated with symptoms in the eyes ears or throat. There are several types of rhinitis.  The most common are acute rhinitis, which is usually caused by a viral illness, allergic or seasonal rhinitis, and nonallergic or year-round rhinitis.  Nasal allergies occur certain times of the year.  Allergic rhinitis is caused when allergens in the air trigger the release of histamine in the body.  Histamine causes itching, swelling, and fluid to build up in the fragile linings of the nasal passages, sinuses and eyelids.  An itchy nose and clear discharge are common.  Continue taking Zyrtec 10 mg once daily  I also would recommend a nasal spray: Flonase 2 sprays into each nostril once daily  For allergies in the eyes  You may also benefit from eye drops such as: Olopatadine 1 drop in each eye once daily   For  exacerbation of your Asthma  I have prescribed prednisone 10 mg taper.   Prednisone 10 mg daily for 6 days (see taper instructions below) Directions for 6 day taper: Day 1: 2 tablets before breakfast, 1 after both lunch & dinner and 2 at bedtime Day 2: 1 tab before breakfast, 1 after both lunch & dinner and 2 at bedtime Day 3: 1 tab at each meal & 1 at bedtime Day 4: 1 tab at breakfast, 1 at lunch, 1 at bedtime Day 5: 1 tab at breakfast & 1 tab at bedtime Day 6: 1 tab at breakfast    HOME CARE:   You can use an over-the-counter saline nasal spray as needed  Avoid areas where there is heavy dust, mites, or molds  Stay indoors on windy days during the pollen season  Keep windows closed in home, at least in bedroom; use air  conditioner.  Use high-efficiency house air filter  Keep windows closed in car, turn AC on re-circulate  Avoid playing out with dog during pollen season  GET HELP RIGHT AWAY IF:   If your symptoms do not improve within 10 days  You become short of breath  You develop yellow or green discharge from your nose for over 3 days  You have coughing fits  MAKE SURE YOU:   Understand these instructions  Will watch your condition  Will get help right away if you are not doing well or get worse     USE OF BRONCHODILATOR ("RESCUE") INHALERS: There is a risk from using your bronchodilator too frequently.  The risk is that over-reliance on a medication which only relaxes the muscles surrounding the breathing tubes can reduce the effectiveness of medications prescribed to reduce swelling and congestion of the tubes themselves.  Although you feel brief relief from the bronchodilator inhaler, your asthma may actually be worsening with the tubes becoming more swollen and filled with mucus.  This can delay other crucial treatments, such as oral steroid medications. If you need to use a bronchodilator inhaler daily, several times per day, you should discuss this with your provider.  There are probably better treatments that could be used to keep your asthma under control.     HOME CARE . Only take  medications as instructed by your medical team. . Complete the entire course of an antibiotic. . Drink plenty of fluids and get plenty of rest. . Avoid close contacts especially the very young and the elderly . Cover your mouth if you cough or cough into your sleeve. . Always remember to wash your hands . A steam or ultrasonic humidifier can help congestion.   GET HELP RIGHT AWAY IF: . You develop worsening fever. . You become short of breath . You cough up blood. . Your symptoms persist after you have completed your treatment plan MAKE SURE YOU   Understand these instructions.  Will watch  your condition.  Will get help right away if you are not doing well or get worse.  Your e-visit answers were reviewed by a board certified advanced clinical practitioner to complete your personal care plan.  Depending on the condition, your plan could have included both over the counter or prescription medications. If there is a problem please reply  once you have received a response from your provider. Your safety is important to [place].  If you have drug allergies check your prescription carefully.    You can use MyChart to ask questions about today's visit, request a non-urgent call back, or ask for a work or school excuse for 24 hours related to this e-Visit. If it has been greater than 24 hours you will need to follow up with your provider, or enter a new e-Visit to address those concerns. You will get an e-mail in the next two days asking about your experience.  I hope that your e-visit has been valuable and will speed your recovery. Thank you for using e-visits.

## 2018-10-20 ENCOUNTER — Ambulatory Visit: Payer: 59 | Admitting: Internal Medicine

## 2018-12-08 NOTE — Telephone Encounter (Signed)
Raquel Sarna, can you schedule?

## 2018-12-12 ENCOUNTER — Ambulatory Visit (INDEPENDENT_AMBULATORY_CARE_PROVIDER_SITE_OTHER): Payer: 59 | Admitting: Primary Care

## 2018-12-12 DIAGNOSIS — J4530 Mild persistent asthma, uncomplicated: Secondary | ICD-10-CM

## 2018-12-12 DIAGNOSIS — I10 Essential (primary) hypertension: Secondary | ICD-10-CM | POA: Diagnosis not present

## 2018-12-12 DIAGNOSIS — J309 Allergic rhinitis, unspecified: Secondary | ICD-10-CM

## 2018-12-12 DIAGNOSIS — J45901 Unspecified asthma with (acute) exacerbation: Secondary | ICD-10-CM | POA: Insufficient documentation

## 2018-12-12 DIAGNOSIS — J4541 Moderate persistent asthma with (acute) exacerbation: Secondary | ICD-10-CM | POA: Diagnosis not present

## 2018-12-12 DIAGNOSIS — H1013 Acute atopic conjunctivitis, bilateral: Secondary | ICD-10-CM

## 2018-12-12 MED ORDER — FLUTICASONE PROPIONATE 50 MCG/ACT NA SUSP: 1.0000 | Freq: Two times a day (BID) | NASAL | 3 refills | Status: DC | PRN

## 2018-12-12 MED ORDER — ALBUTEROL SULFATE HFA 108 (90 BASE) MCG/ACT IN AERS: 2.0000 | INHALATION_SPRAY | RESPIRATORY_TRACT | 0 refills | Status: DC | PRN

## 2018-12-12 MED ORDER — IPRATROPIUM-ALBUTEROL 0.5-2.5 (3) MG/3ML IN SOLN
3.0000 mL | Freq: Once | RESPIRATORY_TRACT | Status: AC
  Administered 2018-12-12: 3 mL via RESPIRATORY_TRACT

## 2018-12-12 MED ORDER — PREDNISONE 20 MG PO TABS: ORAL_TABLET | ORAL | 0 refills | Status: DC

## 2018-12-12 MED ORDER — OLOPATADINE HCL 0.2 % OP SOLN: OPHTHALMIC | 0 refills | Status: DC

## 2018-12-12 MED FILL — FLUTICASONE PROP 50 MCG SPR: 50 | 30 days supply | Qty: 16 | Fill #0

## 2018-12-12 MED FILL — OLOPATADINE HCL 0.2% EYE DR: 0.2 | 30 days supply | Qty: 3 | Fill #0

## 2018-12-12 MED FILL — VENTOLIN HFA 90 MCG INHALER: 108 (90 BAS | 16 days supply | Qty: 18 | Fill #0

## 2018-12-12 MED FILL — predniSONE 20 MG TABS: 20 | 5 days supply | Qty: 10 | Fill #0

## 2018-12-12 NOTE — Patient Instructions (Addendum)
Start prednisone 20 mg tablets. Take 2 tablets once daily for 5 days.  Shortness of Breath/Wheezing/Cough: Use the albuterol inhaler. Inhale 2 puffs into the lungs every 4 to 6 hours as needed for wheezing, cough, and/or shortness of breath.   Use the Flonase nasal spray and allergy eye drops as needed.  Please update me next week or sooner if no improvement.  Please schedule a physical with me in late February or early March 2020. You may also schedule a lab only appointment 3-4 days prior. We will discuss your lab results in detail during your physical.  It was a pleasure to see you today!
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
## 2018-12-12 NOTE — Assessment & Plan Note (Signed)
Over the last two weeks, ran out of allergy medication. Exam today without distress but she is having to stop every 10-15 words in order to catch her breath. Duoneb provided today. Rx for prednisone burst; and refill for albuterol inhaler/Flonase/olopatadine drops sent to pharmacy.  If her symptoms do not improve then we will need to consider ICS. She will update.  She is stable for outpatient treatment.

## 2018-12-12 NOTE — Assessment & Plan Note (Signed)
No medication for 2 days, discussed to resume and monitor BP.

## 2018-12-16 DIAGNOSIS — G4733 Obstructive sleep apnea (adult) (pediatric): Secondary | ICD-10-CM | POA: Diagnosis not present

## 2018-12-28 DIAGNOSIS — G4733 Obstructive sleep apnea (adult) (pediatric): Secondary | ICD-10-CM | POA: Diagnosis not present

## 2018-12-29 ENCOUNTER — Telehealth: Payer: Self-pay

## 2018-12-30 ENCOUNTER — Telehealth: Payer: Self-pay

## 2018-12-30 DIAGNOSIS — G4733 Obstructive sleep apnea (adult) (pediatric): Secondary | ICD-10-CM

## 2018-12-30 DIAGNOSIS — G4719 Other hypersomnia: Secondary | ICD-10-CM

## 2018-12-30 DIAGNOSIS — R0683 Snoring: Secondary | ICD-10-CM

## 2018-12-30 NOTE — Telephone Encounter (Signed)
error 

## 2018-12-30 NOTE — Telephone Encounter (Signed)
LM for patient to call for sleep study results.   Moderate OSA with AHI of 29. Recommend auto-CPAP with pressure range of 5-20 cm H2O.

## 2019-01-01 ENCOUNTER — Encounter (HOSPITAL_COMMUNITY): Payer: Self-pay

## 2019-01-01 ENCOUNTER — Emergency Department (HOSPITAL_COMMUNITY)
Admission: EM | Admit: 2019-01-01 | Discharge: 2019-01-01 | Disposition: A | Discharge status: Home / Self Care | Payer: 59 | Attending: Emergency Medicine | Admitting: Emergency Medicine

## 2019-01-01 ENCOUNTER — Other Ambulatory Visit: Payer: Self-pay

## 2019-01-01 DIAGNOSIS — Y929 Unspecified place or not applicable: Secondary | ICD-10-CM | POA: Diagnosis not present

## 2019-01-01 DIAGNOSIS — W208XXA Other cause of strike by thrown, projected or falling object, initial encounter: Secondary | ICD-10-CM | POA: Diagnosis not present

## 2019-01-01 DIAGNOSIS — Y9389 Activity, other specified: Secondary | ICD-10-CM | POA: Insufficient documentation

## 2019-01-01 DIAGNOSIS — E039 Hypothyroidism, unspecified: Secondary | ICD-10-CM | POA: Insufficient documentation

## 2019-01-01 DIAGNOSIS — Z87891 Personal history of nicotine dependence: Secondary | ICD-10-CM | POA: Diagnosis not present

## 2019-01-01 DIAGNOSIS — I10 Essential (primary) hypertension: Secondary | ICD-10-CM | POA: Diagnosis not present

## 2019-01-01 DIAGNOSIS — S0501XA Injury of conjunctiva and corneal abrasion without foreign body, right eye, initial encounter: Secondary | ICD-10-CM | POA: Insufficient documentation

## 2019-01-01 DIAGNOSIS — Z79899 Other long term (current) drug therapy: Secondary | ICD-10-CM | POA: Insufficient documentation

## 2019-01-01 DIAGNOSIS — J45909 Unspecified asthma, uncomplicated: Secondary | ICD-10-CM | POA: Insufficient documentation

## 2019-01-01 DIAGNOSIS — Y998 Other external cause status: Secondary | ICD-10-CM | POA: Insufficient documentation

## 2019-01-01 DIAGNOSIS — S0591XA Unspecified injury of right eye and orbit, initial encounter: Secondary | ICD-10-CM | POA: Diagnosis present

## 2019-01-01 MED ORDER — TETRACAINE HCL 0.5 % OP SOLN
2.0000 [drp] | Freq: Once | OPHTHALMIC | Status: AC
  Administered 2019-01-01: 2 [drp] via OPHTHALMIC
  Filled 2019-01-01: qty 4

## 2019-01-01 MED ORDER — FLUORESCEIN SODIUM 1 MG OP STRP
1.0000 | ORAL_STRIP | Freq: Once | OPHTHALMIC | Status: AC
  Administered 2019-01-01: 1 via OPHTHALMIC
  Filled 2019-01-01: qty 1

## 2019-01-01 MED ORDER — ERYTHROMYCIN 5 MG/GM OP OINT: 1.0000 "application " | TOPICAL_OINTMENT | Freq: Four times a day (QID) | OPHTHALMIC | 0 refills | Status: AC
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
## 2019-01-01 NOTE — Discharge Instructions (Signed)
Try to avoid rubbing eyes. Apply warm or cool compresses and use prescribed eye drops/ointments as directed. Alternate between tylenol and motrin as needed for pain. Follow up with the ophthalmologist in 2-3 days for recheck and ongoing management. Return to the ER for changes or worsening symptoms.
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
## 2019-01-02 NOTE — Telephone Encounter (Signed)
Pt called back- CB # [phone]

## 2019-01-02 NOTE — Telephone Encounter (Signed)
Spoke to patient about sleep study results. Orders entered.

## 2019-01-02 NOTE — Telephone Encounter (Signed)
Pt is calling back [phone]

## 2019-01-04 DIAGNOSIS — G4733 Obstructive sleep apnea (adult) (pediatric): Secondary | ICD-10-CM | POA: Diagnosis not present

## 2019-01-26 ENCOUNTER — Other Ambulatory Visit: Payer: Self-pay

## 2019-01-26 ENCOUNTER — Encounter: Payer: Self-pay | Admitting: Physician Assistant

## 2019-01-26 ENCOUNTER — Ambulatory Visit: Payer: Self-pay | Admitting: Physician Assistant

## 2019-01-26 VITALS — BP 128/80 | HR 82 | Temp 98.6°F | Resp 16 | Ht 64.0 in | Wt 190.0 lb

## 2019-01-26 DIAGNOSIS — H1013 Acute atopic conjunctivitis, bilateral: Secondary | ICD-10-CM

## 2019-01-26 DIAGNOSIS — J069 Acute upper respiratory infection, unspecified: Secondary | ICD-10-CM

## 2019-01-26 DIAGNOSIS — I10 Essential (primary) hypertension: Secondary | ICD-10-CM

## 2019-01-26 MED ORDER — FLUTICASONE PROPIONATE 50 MCG/ACT NA SUSP: 2.0000 | Freq: Every day | NASAL | 0 refills | Status: DC

## 2019-01-26 MED ORDER — MAGIC MOUTHWASH W/LIDOCAINE: 5.0000 mL | Freq: Four times a day (QID) | ORAL | 0 refills | Status: DC

## 2019-01-26 MED ORDER — PSEUDOEPH-BROMPHEN-DM 30-2-10 MG/5ML PO SYRP: 5.0000 mL | ORAL_SOLUTION | Freq: Four times a day (QID) | ORAL | 0 refills | Status: DC | PRN

## 2019-01-26 MED ORDER — AMLODIPINE-OLMESARTAN 10-20 MG PO TABS: 1.0000 | ORAL_TABLET | Freq: Every day | ORAL | 1 refills | Status: DC

## 2019-01-26 MED ORDER — HYDROCODONE-HOMATROPINE 5-1.5 MG/5ML PO SYRP: 5.0000 mL | ORAL_SOLUTION | Freq: Every day | ORAL | 0 refills | Status: DC

## 2019-01-26 NOTE — Progress Notes (Signed)
Patient ID: Lisa Mullen DOB: [birth year] AGE: 53 y.o. MRN: [id number]   PCP: Pleas Koch, NP   Chief Complaint:  Chief Complaint  Patient presents with  . Headache    x2d  . Chest cong    x2d  . Sore Throat    x2d     Subjective:    HPI:  Lisa Mullen is a 53 y.o. female presents for evaluation  Chief Complaint  Patient presents with  . Headache    x2d  . Chest cong    x2d  . Sore Throat    x23d   53 year old female presents to [facility] with three day history of URI symptoms. Began with sneezing, runny nose (clear thin discharge), and scratchy throat. Sore throat worsened. Severe. Aggravated with swallowing. Developed nasal congestion, bilateral maxillary sinus pressure/congestion, frontal headache, chest congestion, and cough. Cough coarse and productive. States cough causes sternal discomfort, across upper portion of chest. Patient reports associated malaise. Has taken copious amount of OTC medications, including Dayquil, Nyquil, Cepacol throat lozenges, probiotic supplement, Zinc supplement, etc. Patient reports no symptom improvement. States on day 1 and 2 of symptoms had fever, has since resolved. Denies dizziness/lightheadedness, ear pain, neck pain, SOB, wheezing, abdominal pain, nausea/vomiting, diarrhea, rash.  Patient regularly followed by Alma Friendly NP with [facility] Last seen 12/12/2018 for moderate persistent asthma with acute exacerbation. Patient states she has since been seen by pulmonology. Underwent sleep test. Diagnosed with obstructive sleep apnea. Uses CPAP machine. Patient states daytime SOB resolved with CPAP machine. Does not believe she has asthma. Patient still has albuterol inhaler. Does not have daily/maintenance inhaler. Patient with HTN, well controlled on Azor (amlodipine/olmesartan).  A limited review of symptoms was performed, pertinent positives and negatives as mentioned in HPI.  The  following portions of the patient's history were reviewed and updated as appropriate: allergies, current medications and past medical history.  Patient Active Problem List   Diagnosis Date Noted  . Acute asthma exacerbation 12/12/2018  . Snoring 07/29/2018  . Thrombocytopenia ([place]) 02/15/2018  . Mild persistent asthma without complication 55/73/2202  . Preventative health care 10/27/2016  . Hypothyroidism 10/20/2016  . Other fatigue 10/20/2016  . Essential hypertension 10/20/2016    Allergies  Allergen Reactions  . Ciprofloxacin Anaphylaxis and Hives  . Coconut Flavor Anaphylaxis    Current Outpatient Medications on File Prior to Visit  Medication Sig Dispense Refill  . albuterol (PROVENTIL HFA;VENTOLIN HFA) 108 (90 Base) MCG/ACT inhaler Inhale 2 puffs into the lungs every 4 (four) hours as needed for wheezing or shortness of breath. 1 Inhaler 0  . amlodipine-olmesartan (AZOR) 10-20 MG tablet TAKE 1 TABLET BY MOUTH DAILY. 90 tablet 0  . fluticasone (FLONASE) 50 MCG/ACT nasal spray Place 1 spray into both nostrils 2 (two) times daily as needed for allergies or rhinitis. 16 g 3  . levothyroxine (SYNTHROID, LEVOTHROID) 150 MCG tablet Take 1 tablet by mouth every morning on an empty stomach with a full glass of water. 90 tablet 3  . Olopatadine HCl 0.2 % SOLN Apply to affected eye once daily as needed. 1 Bottle 0   No current facility-administered medications on file prior to visit.        Objective:   Vitals:   01/26/19 1021  BP: 128/80  Pulse: 82  Resp: 16  Temp: 98.6 F (37 C)  SpO2: 97%     Wt Readings from Last 3 Encounters:  01/26/19 190 lb (  86.2 kg)  01/01/19 185 lb (83.9 kg)  12/12/18 192 lb 12 oz (87.4 kg)    Physical Exam:   General Appearance:  Patient sitting comfortably on examination table. Conversational. Kermit Balo self-historian. In no acute distress. Afebrile. Patient emotional, began crying when describing her symptoms.  Head:  Normocephalic, without  obvious abnormality, atraumatic  Eyes:  PERRL, conjunctiva/corneas clear, EOM's intact  Ears:  Bilateral ear canals WNL. No erythema or edema. No discharge/drainage. Bilateral TMs WNL. No erythema, injection, or serous effusion. No scar tissue.  Nose: Nares normal, septum midline. Nasal mucosa with bilateral edema. Thick yellow discharge (patient blows in to tissue to show provider). No epistaxis. Mild tenderness with palpation over bilateral maxillary sinuses.  Throat: Lips, mucosa, and tongue normal; teeth and gums normal. Throat and tonsils reveal faint erythema. No tonsillar enlargement. No exudate. No hot potato/muffled voice. No trismus. No stridor. No drooling. No grimacing with swallowing.  Neck: Supple, symmetrical, trachea midline, no adenopathy  Lungs:   Clear to auscultation bilaterally, respirations unlabored. Good aeration. No wheezing. Occasional coarse cough during examination.  Heart:  Regular rate and rhythm, S1 and S2 normal, no murmur, rub, or gallop  Extremities: Extremities normal, atraumatic, no cyanosis or edema  Pulses: 2+ and symmetric  Skin: Skin color, texture, turgor normal, no rashes or lesions  Lymph nodes: Cervical, supraclavicular, and axillary nodes normal  Neurologic: Normal    Assessment & Plan:    Exam findings, diagnosis etiology and medication use and indications reviewed with patient. Follow-Up and discharge instructions provided. No emergent/urgent issues found on exam.  Patient education was provided.   Patient verbalized understanding of information provided and agrees with plan of care (POC), all questions answered. The patient is advised to call or return to clinic if condition does not see an improvement in symptoms, or to seek the care of the closest emergency department if condition worsens with the below plan.    1. Upper respiratory tract infection, unspecified type - fluticasone (FLONASE) 50 MCG/ACT nasal spray; Place 2 sprays into both  nostrils daily.  Dispense: 16 g; Refill: 0 - brompheniramine-pseudoephedrine-DM 30-2-10 MG/5ML syrup; Take 5 mLs by mouth 4 (four) times daily as needed.  Dispense: 120 mL; Refill: 0 - HYDROcodone-homatropine (HYCODAN) 5-1.5 MG/5ML syrup; Take 5 mLs by mouth at bedtime.  Dispense: 60 mL; Refill: 0 - magic mouthwash w/lidocaine SOLN; Take 5 mLs by mouth 4 (four) times daily.  Dispense: 120 mL; Refill: 0  Patient with three day history of URI symptoms. Began with sneezing, rhinorrhea, and irritated throat. Then developed nasal congestion and sinus pressure/congestion. Then developed chest congestion and cough. Progression of symptoms suggestive of self-limited viral URI. Patient afebrile, VSS, in no acute distress, clear lung sounds. Short duration of symptoms. Advised increase fluids, rest, and prescribed symptomatic treatment: Flonase for nasal congestion, Bromphed for cough during the day, Hycodan for cough at night, and Magic Mouthwash for sore throat. Advised patient follow-up with urgent care, family physician or [facility] in 4-5 days if symptoms not improving. Patient agreed with plan.    Darlin Priestly, MHS, PA-C Montey Hora, MHS, PA-C Advanced Practice Provider [facility]  [address] [facility], [place], [place] [address] (p):  [phone] [email or web address]

## 2019-01-26 NOTE — Patient Instructions (Addendum)
Thank you for choosing [facility] for your health care needs.  You have been diagnosed with an upper respiratory infection (a cold).  Recommend you increase fluids; water, Gatorade, hot tea with lemon/honey, or orange juice. Rest. Use cool mist humidifier in bedroom at night. Prop self up with several pillows to help with cough.  Continue to use albuterol inhaler, 2 puffs every 4-6 hours for cough.  Take medications as prescribed: Hydcodan (narcotic) cough syrup - only at night. Bromfed (other, non-narcotic) cough syrup - during the day. Flonase at morning and night. Use mouthwash for pain relief throughout the day.  Follow-up with family physician, urgent care, or [facility] in 4-5 days if symptoms not improving. Go directly to the ED or urgent care if you develop worsening chest pain, shortness of breath, wheezing, etc.  Hope you feel better soon!  Upper Respiratory Infection, Adult An upper respiratory infection (URI) affects the nose, throat, and upper air passages. URIs are caused by germs (viruses). The most common type of URI is often called "the common cold." Medicines cannot cure URIs, but you can do things at home to relieve your symptoms. URIs usually get better within 7-10 days. Follow these instructions at home: Activity  Rest as needed.  If you have a fever, stay home from work or school until your fever is gone, or until your doctor says you may return to work or school. ? You should stay home until you cannot spread the infection anymore (you are not contagious). ? Your doctor may have you wear a face mask so you have less risk of spreading the infection. Relieving symptoms  Gargle with a salt-water mixture 3-4 times a day or as needed. To make a salt-water mixture, completely dissolve -1 tsp of salt in 1 cup of warm water.  Use a cool-mist humidifier to add moisture to the air. This can help you breathe more easily. Eating and drinking   Drink enough fluid to  keep your pee (urine) pale yellow.  Eat soups and other clear broths. General instructions   Take over-the-counter and prescription medicines only as told by your doctor. These include cold medicines, fever reducers, and cough suppressants.  Do not use any products that contain nicotine or tobacco. These include cigarettes and e-cigarettes. If you need help quitting, ask your doctor.  Avoid being where people are smoking (avoid secondhand smoke).  Make sure you get regular shots and get the flu shot every year.  Keep all follow-up visits as told by your doctor. This is important. How to avoid spreading infection to others   Wash your hands often with soap and water. If you do not have soap and water, use hand sanitizer.  Avoid touching your mouth, face, eyes, or nose.  Cough or sneeze into a tissue or your sleeve or elbow. Do not cough or sneeze into your hand or into the air. Contact a doctor if:  You are getting worse, not better.  You have any of these: ? A fever. ? Chills. ? Brown or red mucus in your nose. ? Yellow or brown fluid (discharge)coming from your nose. ? Pain in your face, especially when you bend forward. ? Swollen neck glands. ? Pain with swallowing. ? White areas in the back of your throat. Get help right away if:  You have shortness of breath that gets worse.  You have very bad or constant: ? Headache. ? Ear pain. ? Pain in your forehead, behind your eyes, and over your cheekbones (sinus  pain). ? Chest pain.  You have long-lasting (chronic) lung disease along with any of these: ? Wheezing. ? Long-lasting cough. ? Coughing up blood. ? A change in your usual mucus.  You have a stiff neck.  You have changes in your: ? Vision. ? Hearing. ? Thinking. ? Mood. Summary  An upper respiratory infection (URI) is caused by a germ called a virus. The most common type of URI is often called "the common cold."  URIs usually get better within 7-10  days.  Take over-the-counter and prescription medicines only as told by your doctor. This information is not intended to replace advice given to you by your health care provider. Make sure you discuss any questions you have with your health care provider. Document Released: 05/25/2008 Document Revised: 07/30/2017 Document Reviewed: 07/30/2017 Elsevier Interactive Patient Education  2019 [employer].

## 2019-01-30 ENCOUNTER — Telehealth: Payer: Self-pay | Admitting: Emergency Medicine

## 2019-01-30 NOTE — Telephone Encounter (Signed)
Left message follow up call from [facility] visit

## 2019-02-04 DIAGNOSIS — G4733 Obstructive sleep apnea (adult) (pediatric): Secondary | ICD-10-CM | POA: Diagnosis not present

## 2019-03-05 DIAGNOSIS — G4733 Obstructive sleep apnea (adult) (pediatric): Secondary | ICD-10-CM | POA: Diagnosis not present

## 2019-04-05 DIAGNOSIS — G4733 Obstructive sleep apnea (adult) (pediatric): Secondary | ICD-10-CM | POA: Diagnosis not present

## 2019-04-19 ENCOUNTER — Other Ambulatory Visit: Payer: Self-pay

## 2019-04-19 ENCOUNTER — Emergency Department: Payer: 59

## 2019-04-19 ENCOUNTER — Telehealth: Payer: Self-pay | Admitting: Primary Care

## 2019-04-19 ENCOUNTER — Encounter: Payer: Self-pay | Admitting: Emergency Medicine

## 2019-04-19 ENCOUNTER — Emergency Department
Admission: EM | Admit: 2019-04-19 | Discharge: 2019-04-19 | Disposition: A | Discharge status: Home / Self Care | Payer: 59 | Attending: Emergency Medicine | Admitting: Emergency Medicine

## 2019-04-19 DIAGNOSIS — I10 Essential (primary) hypertension: Secondary | ICD-10-CM | POA: Insufficient documentation

## 2019-04-19 DIAGNOSIS — R079 Chest pain, unspecified: Secondary | ICD-10-CM | POA: Diagnosis not present

## 2019-04-19 DIAGNOSIS — Z87891 Personal history of nicotine dependence: Secondary | ICD-10-CM | POA: Insufficient documentation

## 2019-04-19 DIAGNOSIS — M549 Dorsalgia, unspecified: Secondary | ICD-10-CM | POA: Insufficient documentation

## 2019-04-19 DIAGNOSIS — E039 Hypothyroidism, unspecified: Secondary | ICD-10-CM | POA: Insufficient documentation

## 2019-04-19 DIAGNOSIS — R030 Elevated blood-pressure reading, without diagnosis of hypertension: Secondary | ICD-10-CM

## 2019-04-19 DIAGNOSIS — Z20828 Contact with and (suspected) exposure to other viral communicable diseases: Secondary | ICD-10-CM | POA: Diagnosis not present

## 2019-04-19 DIAGNOSIS — M545 Low back pain: Secondary | ICD-10-CM | POA: Diagnosis not present

## 2019-04-19 DIAGNOSIS — R0602 Shortness of breath: Secondary | ICD-10-CM | POA: Diagnosis not present

## 2019-04-19 HISTORY — DX: Essential (primary) hypertension: I10

## 2019-04-19 LAB — URINALYSIS, COMPLETE (UACMP) WITH MICROSCOPIC
Bilirubin Urine: NEGATIVE
Glucose, UA: NEGATIVE mg/dL
Ketones, ur: NEGATIVE mg/dL
Nitrite: NEGATIVE
Protein, ur: 30 mg/dL — AB
Specific Gravity, Urine: 1.01 (ref 1.005–1.030)
pH: 7 (ref 5.0–8.0)

## 2019-04-19 LAB — BASIC METABOLIC PANEL
Anion gap: 9 (ref 5–15)
BUN: 10 mg/dL (ref 6–20)
CO2: 26 mmol/L (ref 22–32)
Calcium: 9.6 mg/dL (ref 8.9–10.3)
Chloride: 107 mmol/L (ref 98–111)
Creatinine, Ser: 0.81 mg/dL (ref 0.44–1.00)
GFR calc Af Amer: >60 mL/min (ref >60)
GFR calc non Af Amer: >60 mL/min (ref >60)
Glucose, Bld: 98 mg/dL (ref 70–99)
Potassium: 3.2 mmol/L — ABNORMAL LOW (ref 3.5–5.1)
Sodium: 142 mmol/L (ref 135–145)

## 2019-04-19 LAB — HEPATIC FUNCTION PANEL
ALT: 24 U/L (ref 0–44)
AST: 21 U/L (ref 15–41)
Albumin: 4.4 g/dL (ref 3.5–5.0)
Alkaline Phosphatase: 63 U/L (ref 38–126)
Bilirubin, Direct: <0.1 mg/dL (ref 0.0–0.2)
Total Bilirubin: 0.4 mg/dL (ref 0.3–1.2)
Total Protein: 8.3 g/dL — ABNORMAL HIGH (ref 6.5–8.1)

## 2019-04-19 LAB — CBC
HCT: 37.4 % (ref 36.0–46.0)
Hemoglobin: 12.2 g/dL (ref 12.0–15.0)
MCH: 27.1 pg (ref 26.0–34.0)
MCHC: 32.6 g/dL (ref 30.0–36.0)
MCV: 82.9 fL (ref 80.0–100.0)
Platelets: 491 10*3/uL — ABNORMAL HIGH (ref 150–400)
RBC: 4.51 MIL/uL (ref 3.87–5.11)
RDW: 13.6 % (ref 11.5–15.5)
WBC: 7.7 10*3/uL (ref 4.0–10.5)
nRBC: 0 % (ref 0.0–0.2)

## 2019-04-19 LAB — LACTIC ACID, PLASMA: Lactic Acid, Venous: 1.5 mmol/L (ref 0.5–1.9)

## 2019-04-19 LAB — TROPONIN I: Troponin I: <0.03 ng/mL (ref <0.03)

## 2019-04-19 LAB — TRIGLYCERIDES: Triglycerides: 139 mg/dL (ref <150)

## 2019-04-19 LAB — C-REACTIVE PROTEIN: CRP: <0.8 mg/dL (ref <1.0)

## 2019-04-19 LAB — LACTATE DEHYDROGENASE: LDH: 183 U/L (ref 98–192)

## 2019-04-19 LAB — PROCALCITONIN: Procalcitonin: <0.1 ng/mL

## 2019-04-19 LAB — FIBRINOGEN: Fibrinogen: 313 mg/dL (ref 210–475)

## 2019-04-19 LAB — FIBRIN DERIVATIVES D-DIMER (ARMC ONLY): Fibrin derivatives D-dimer (ARMC): 459.99 ng/mL (FEU) (ref 0.00–499.00)

## 2019-04-19 LAB — FERRITIN: Ferritin: 78 ng/mL (ref 11–307)

## 2019-04-19 LAB — SARS CORONAVIRUS 2 BY RT PCR (HOSPITAL ORDER, PERFORMED IN ~~LOC~~ HOSPITAL LAB): SARS Coronavirus 2: NEGATIVE

## 2019-04-19 MED ORDER — ONDANSETRON HCL 4 MG/2ML IJ SOLN
4.0000 mg | Freq: Once | INTRAMUSCULAR | Status: AC | PRN
  Administered 2019-04-19: 4 mg via INTRAVENOUS
  Filled 2019-04-19: qty 2

## 2019-04-19 MED ORDER — DIAZEPAM 5 MG PO TABS
5.0000 mg | ORAL_TABLET | Freq: Once | ORAL | Status: AC
  Administered 2019-04-19: 09:00:00 5 mg via ORAL
  Filled 2019-04-19: qty 1

## 2019-04-19 MED ORDER — KETOROLAC TROMETHAMINE 30 MG/ML IJ SOLN
30.0000 mg | Freq: Once | INTRAMUSCULAR | Status: AC
  Administered 2019-04-19: 07:00:00 30 mg via INTRAVENOUS
  Filled 2019-04-19: qty 1

## 2019-04-19 MED ORDER — DIAZEPAM 5 MG PO TABS: 5.0000 mg | ORAL_TABLET | Freq: Three times a day (TID) | ORAL | 0 refills | Status: DC | PRN

## 2019-04-19 NOTE — ED Notes (Signed)
Lactic acid recollection sent to lab

## 2019-04-19 NOTE — Telephone Encounter (Signed)
Raquel Sarna, will you please schedule this patient for a ED follow up virtually? We need to discuss her blood pressure. Thanks.

## 2019-04-19 NOTE — ED Triage Notes (Signed)
Pt to triage via w/c with no distress noted; Pt reports that her BP 177/117, accomp by pain between shoulder blades, HA and SHOB; st takes no BP meds currently
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
## 2019-04-19 NOTE — Telephone Encounter (Signed)
Lvm asking pt to call office 
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
## 2019-04-20 ENCOUNTER — Encounter: Payer: Self-pay | Admitting: Primary Care

## 2019-04-20 ENCOUNTER — Telehealth: Payer: Self-pay | Admitting: Emergency Medicine

## 2019-04-20 ENCOUNTER — Other Ambulatory Visit: Payer: Self-pay | Admitting: Primary Care

## 2019-04-20 ENCOUNTER — Ambulatory Visit (INDEPENDENT_AMBULATORY_CARE_PROVIDER_SITE_OTHER): Payer: 59 | Admitting: Primary Care

## 2019-04-20 DIAGNOSIS — E039 Hypothyroidism, unspecified: Secondary | ICD-10-CM

## 2019-04-20 DIAGNOSIS — I10 Essential (primary) hypertension: Secondary | ICD-10-CM

## 2019-04-20 DIAGNOSIS — G4733 Obstructive sleep apnea (adult) (pediatric): Secondary | ICD-10-CM

## 2019-04-20 DIAGNOSIS — J4530 Mild persistent asthma, uncomplicated: Secondary | ICD-10-CM

## 2019-04-20 LAB — BLOOD CULTURE ID PANEL (REFLEXED)

## 2019-04-20 NOTE — Assessment & Plan Note (Signed)
Overall doing well on PRN albuterol. Continue same.

## 2019-04-20 NOTE — Patient Instructions (Signed)
Resume your amlodipine-olmesartan blood pressure medication and take once daily.  We will be in touch regarding a lab appointment as discussed.  Please send me some blood pressure readings in 2 weeks via my chart.  It was a pleasure to see you today! Allie Bossier, NP-C

## 2019-04-20 NOTE — Assessment & Plan Note (Signed)
Stopped taking amlodipine-olmesartan in January 2020, also stopped wearing CPAP 2 weeks ago.  BP above goal, also symptomatic with headaches and dizziness. Resume amlodipine-olmesartan for now. She will monitor BP and update me via my chart in 2 weeks with readings.
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
## 2019-04-20 NOTE — Telephone Encounter (Signed)
Called patient and gave covid 19 result and discussed blood culture which is likely contaminate.  She is doing a virtual visit with pcp today.

## 2019-04-20 NOTE — Assessment & Plan Note (Signed)
Stopped wearing CPAP 2 weeks ago due to intolerance. Suspect this could be contributing to elevated BP readings. Strongly advised she contact her pulmonologist for further recommendations regarding her CPAP.

## 2019-04-20 NOTE — Assessment & Plan Note (Signed)
Compliant to levothyroxine and is taking appropriately.  Repeat TSH pending.

## 2019-04-21 ENCOUNTER — Other Ambulatory Visit: Payer: Self-pay | Admitting: Primary Care

## 2019-04-21 ENCOUNTER — Other Ambulatory Visit: Payer: Self-pay

## 2019-04-21 ENCOUNTER — Other Ambulatory Visit (INDEPENDENT_AMBULATORY_CARE_PROVIDER_SITE_OTHER): Payer: 59

## 2019-04-21 DIAGNOSIS — I10 Essential (primary) hypertension: Secondary | ICD-10-CM | POA: Diagnosis not present

## 2019-04-21 DIAGNOSIS — E039 Hypothyroidism, unspecified: Secondary | ICD-10-CM | POA: Diagnosis not present

## 2019-04-21 LAB — BASIC METABOLIC PANEL
BUN: 16 mg/dL (ref 6–23)
CO2: 29 mEq/L (ref 19–32)
Calcium: 9.2 mg/dL (ref 8.4–10.5)
Chloride: 105 mEq/L (ref 96–112)
Creatinine, Ser: 0.84 mg/dL (ref 0.40–1.20)
GFR: 86.54 mL/min (ref >60.00)
Glucose, Bld: 99 mg/dL (ref 70–99)
Potassium: 3.7 mEq/L (ref 3.5–5.1)
Sodium: 142 mEq/L (ref 135–145)

## 2019-04-21 LAB — LIPID PANEL
Cholesterol: 198 mg/dL (ref 0–200)
HDL: 47.3 mg/dL (ref >39.00)
LDL Cholesterol: 128 mg/dL — ABNORMAL HIGH (ref 0–99)
NonHDL: 150.95
Total CHOL/HDL Ratio: 4
Triglycerides: 115 mg/dL (ref 0.0–149.0)
VLDL: 23 mg/dL (ref 0.0–40.0)

## 2019-04-21 LAB — TSH: TSH: 6.81 u[IU]/mL — ABNORMAL HIGH (ref 0.35–4.50)

## 2019-04-21 NOTE — Telephone Encounter (Signed)
Spoke with patient regarding concerns.

## 2019-04-22 LAB — CULTURE, BLOOD (ROUTINE X 2): Special Requests: ADEQUATE

## 2019-04-23 ENCOUNTER — Other Ambulatory Visit: Payer: Self-pay | Admitting: Primary Care

## 2019-04-23 DIAGNOSIS — E039 Hypothyroidism, unspecified: Secondary | ICD-10-CM

## 2019-04-24 LAB — CULTURE, BLOOD (ROUTINE X 2)
Culture: NO GROWTH
Special Requests: ADEQUATE

## 2019-04-24 MED ORDER — LEVOTHYROXINE SODIUM 150 MCG PO TABS: ORAL_TABLET | ORAL | 0 refills | Status: DC

## 2019-04-25 ENCOUNTER — Ambulatory Visit (INDEPENDENT_AMBULATORY_CARE_PROVIDER_SITE_OTHER): Payer: 59 | Admitting: Primary Care

## 2019-04-25 ENCOUNTER — Other Ambulatory Visit: Payer: 59

## 2019-04-25 ENCOUNTER — Other Ambulatory Visit: Payer: Self-pay

## 2019-04-25 VITALS — BP 136/86 | HR 84 | Temp 98.2°F | Ht 64.0 in | Wt 196.2 lb

## 2019-04-25 DIAGNOSIS — R350 Frequency of micturition: Secondary | ICD-10-CM

## 2019-04-25 DIAGNOSIS — I10 Essential (primary) hypertension: Secondary | ICD-10-CM

## 2019-04-25 HISTORY — DX: Frequency of micturition: R35.0

## 2019-04-25 LAB — POC URINALSYSI DIPSTICK (AUTOMATED)
Bilirubin, UA: NEGATIVE
Blood, UA: NEGATIVE
Glucose, UA: NEGATIVE
Ketones, UA: NEGATIVE
Leukocytes, UA: NEGATIVE
Nitrite, UA: NEGATIVE
Protein, UA: NEGATIVE
Spec Grav, UA: 1.02 (ref 1.010–1.025)
Urobilinogen, UA: 0.2 E.U./dL
pH, UA: 6 (ref 5.0–8.0)

## 2019-04-25 NOTE — Assessment & Plan Note (Signed)
Present for the last 5-6 days, no alarm signs. Exam today unremarkable. UA today negative. Culture sent. BMP last week unremarkable.  Discussed to urinate after intercourse, make sure to hydrate properly with water.  Will await urine culture results.
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
## 2019-04-25 NOTE — Patient Instructions (Signed)
Continue to push intake of water for a goal of 64 ounces daily.  We will be in touch once we receive your urine specimen results.   It was a pleasure to see you today!

## 2019-04-25 NOTE — Assessment & Plan Note (Signed)
Improved, has not taken dose of BP medication today. She will continue to monitor.

## 2019-04-27 LAB — URINE CULTURE
MICRO NUMBER:: 446429
SPECIMEN QUALITY:: ADEQUATE

## 2019-05-05 ENCOUNTER — Telehealth: Payer: Self-pay | Admitting: Primary Care

## 2019-05-05 NOTE — Telephone Encounter (Signed)
Message left for patient to return my call.  

## 2019-05-05 NOTE — Telephone Encounter (Signed)
-----   Message from Pleas Koch, NP sent at 04/21/2019  1:17 PM EDT ----- Regarding: BP Will you please call patient to check on BP? What are her readings since we re-initiated her medication? How's she feeling?

## 2019-05-10 NOTE — Telephone Encounter (Signed)
Noted. Please thank patient for those readings. Have her notify me if she sees readings consistently at or above 135/90.

## 2019-05-10 NOTE — Telephone Encounter (Signed)
Spoken and notified patient of Kate Clark's comments. Patient verbalized understanding.  

## 2019-05-10 NOTE — Telephone Encounter (Signed)
Patient called back. She stated that she checking BP but sometimes she forgets. However, it is up and down. She stated the average would be 140-120 over 80. She feels okay.

## 2019-09-12 ENCOUNTER — Other Ambulatory Visit: Payer: Self-pay

## 2019-09-12 DIAGNOSIS — H1013 Acute atopic conjunctivitis, bilateral: Secondary | ICD-10-CM

## 2019-09-12 DIAGNOSIS — E039 Hypothyroidism, unspecified: Secondary | ICD-10-CM

## 2019-09-12 DIAGNOSIS — J4530 Mild persistent asthma, uncomplicated: Secondary | ICD-10-CM

## 2019-09-13 DIAGNOSIS — E039 Hypothyroidism, unspecified: Secondary | ICD-10-CM

## 2019-09-13 MED ORDER — LEVOTHYROXINE SODIUM 150 MCG PO TABS: ORAL_TABLET | ORAL | 0 refills | Status: DC

## 2019-09-13 MED ORDER — ALBUTEROL SULFATE HFA 108 (90 BASE) MCG/ACT IN AERS: 2.0000 | INHALATION_SPRAY | RESPIRATORY_TRACT | 0 refills | Status: DC | PRN

## 2019-09-13 MED ORDER — OLOPATADINE HCL 0.2 % OP SOLN: OPHTHALMIC | 0 refills | Status: DC

## 2019-11-23 ENCOUNTER — Encounter: Payer: Self-pay | Admitting: Primary Care

## 2019-11-23 ENCOUNTER — Other Ambulatory Visit: Payer: Self-pay | Admitting: Primary Care

## 2019-11-23 ENCOUNTER — Ambulatory Visit (INDEPENDENT_AMBULATORY_CARE_PROVIDER_SITE_OTHER): Payer: 59 | Admitting: Primary Care

## 2019-11-23 ENCOUNTER — Other Ambulatory Visit: Payer: Self-pay

## 2019-11-23 VITALS — BP 130/84 | HR 86 | Temp 97.6°F | Ht 64.0 in | Wt 198.0 lb

## 2019-11-23 DIAGNOSIS — R319 Hematuria, unspecified: Secondary | ICD-10-CM | POA: Diagnosis not present

## 2019-11-23 DIAGNOSIS — G4733 Obstructive sleep apnea (adult) (pediatric): Secondary | ICD-10-CM | POA: Diagnosis not present

## 2019-11-23 DIAGNOSIS — R0602 Shortness of breath: Secondary | ICD-10-CM

## 2019-11-23 DIAGNOSIS — E039 Hypothyroidism, unspecified: Secondary | ICD-10-CM | POA: Diagnosis not present

## 2019-11-23 DIAGNOSIS — K219 Gastro-esophageal reflux disease without esophagitis: Secondary | ICD-10-CM | POA: Diagnosis not present

## 2019-11-23 DIAGNOSIS — D571 Sickle-cell disease without crisis: Secondary | ICD-10-CM | POA: Diagnosis not present

## 2019-11-23 DIAGNOSIS — J4530 Mild persistent asthma, uncomplicated: Secondary | ICD-10-CM

## 2019-11-23 DIAGNOSIS — D573 Sickle-cell trait: Secondary | ICD-10-CM | POA: Insufficient documentation

## 2019-11-23 DIAGNOSIS — I10 Essential (primary) hypertension: Secondary | ICD-10-CM | POA: Diagnosis not present

## 2019-11-23 HISTORY — DX: Shortness of breath: R06.02

## 2019-11-23 LAB — POC URINALSYSI DIPSTICK (AUTOMATED)
Bilirubin, UA: NEGATIVE
Glucose, UA: NEGATIVE
Ketones, UA: NEGATIVE
Leukocytes, UA: NEGATIVE
Nitrite, UA: NEGATIVE
Protein, UA: NEGATIVE
Spec Grav, UA: 1.02 (ref 1.010–1.025)
Urobilinogen, UA: 0.2 E.U./dL
pH, UA: 6 (ref 5.0–8.0)

## 2019-11-23 LAB — TSH: TSH: 8.55 u[IU]/mL — ABNORMAL HIGH (ref 0.35–4.50)

## 2019-11-23 LAB — CBC WITH DIFFERENTIAL/PLATELET
Basophils Absolute: 0.1 10*3/uL (ref 0.0–0.1)
Basophils Relative: 1.1 % (ref 0.0–3.0)
Eosinophils Absolute: 0.6 10*3/uL (ref 0.0–0.7)
Eosinophils Relative: 12 % — ABNORMAL HIGH (ref 0.0–5.0)
HCT: 37.3 % (ref 36.0–46.0)
Hemoglobin: 12 g/dL (ref 12.0–15.0)
Lymphocytes Relative: 32.8 % (ref 12.0–46.0)
Lymphs Abs: 1.7 10*3/uL (ref 0.7–4.0)
MCHC: 32.1 g/dL (ref 30.0–36.0)
MCV: 84.9 fl (ref 78.0–100.0)
Monocytes Absolute: 0.4 10*3/uL (ref 0.1–1.0)
Monocytes Relative: 8.5 % (ref 3.0–12.0)
Neutro Abs: 2.4 10*3/uL (ref 1.4–7.7)
Neutrophils Relative %: 45.6 % (ref 43.0–77.0)
Platelets: 461 10*3/uL — ABNORMAL HIGH (ref 150.0–400.0)
RBC: 4.4 Mil/uL (ref 3.87–5.11)
RDW: 13.9 % (ref 11.5–15.5)
WBC: 5.2 10*3/uL (ref 4.0–10.5)

## 2019-11-23 MED ORDER — FLOVENT HFA 110 MCG/ACT IN AERO: 1.0000 | INHALATION_SPRAY | Freq: Two times a day (BID) | RESPIRATORY_TRACT | 2 refills | Status: DC

## 2019-11-23 MED ORDER — OMEPRAZOLE 20 MG PO CPDR: 20.0000 mg | DELAYED_RELEASE_CAPSULE | Freq: Every day | ORAL | 0 refills | Status: DC
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
## 2019-11-23 NOTE — Assessment & Plan Note (Signed)
Daily symptoms of resting and exertional shortness of breath without wheezing. No wheezing on exam. Little improvement with albuterol inhaler.   Symptoms could be secondary to numerous etiologies. Start with ICS. Rx for Flovent inhaler sent to pharmacy, discussed to rinse mouth after use.   Checking labs.

## 2019-11-23 NOTE — Assessment & Plan Note (Signed)
Patient endorses history, she's not sure if she has the trait or disease. Check CBC and reticulocyte count today given SOB.

## 2019-11-23 NOTE — Assessment & Plan Note (Signed)
Compliant to CPAP machine while sleeping. Continue same.

## 2019-11-23 NOTE — Assessment & Plan Note (Signed)
Non compliant to levothyroxine regimen, also works night shift.  Long discussion today regarding the importance of levothyroxine compliance, discussed to take when she gets home from work if this is an easier time to remember.   Repeat TSH pending today. Will repeat again in 4 weeks after total compliance.

## 2019-11-23 NOTE — Assessment & Plan Note (Addendum)
Frequent symptoms. Could be contributing to chest soreness. Trial of omeprazole 20 mg daily x 30 days. She will update.

## 2019-11-23 NOTE — Assessment & Plan Note (Signed)
Stable in the office today, continue current regimen. BMP pending. 

## 2019-11-23 NOTE — Patient Instructions (Addendum)
Stop by the lab prior to leaving today. I will notify you of your results once received.   Start fluticasone inhaler. Inhale 1 puff into the lungs twice daily, everyday.  Be sure to take your levothyroxine (thyroid medication) every morning on an empty stomach with water only. No food or other medications for 30 minutes. No heartburn medication, iron pills, calcium, vitamin D, or magnesium pills within four hours of taking levothyroxine.   Start omeprazole 20 mg capsules. Take this at the opposite time of day from your levothyroxine medication.   Schedule a follow up visit with me in 1 month.  It was a pleasure to see you today!

## 2019-11-23 NOTE — Assessment & Plan Note (Signed)
Chronic.   Multiple potential etiologies including asthma, sickle cell, uncontrolled hypothyroidism, deconditioning, anemia, etc.  Start with adding ICS inhaler. Check labs today including CBC, TSH, reticulocyte count, d-dimer.   ECG today with NSR with rate of 66, no acute ST changes, no t-wave inversion. Appears similar to ECG from April 2020. Consider cardiology evaluation, await labs and other treatment.

## 2019-11-23 NOTE — Assessment & Plan Note (Signed)
Acute for one week.  UA today with trace blood, no other abnormality. No blood noticed on other recent UA test.  Culture sent. Await treatment. Consider urology evaluation if no infection.

## 2019-11-24 DIAGNOSIS — R7989 Other specified abnormal findings of blood chemistry: Secondary | ICD-10-CM

## 2019-11-24 DIAGNOSIS — D571 Sickle-cell disease without crisis: Secondary | ICD-10-CM

## 2019-11-24 LAB — D-DIMER, QUANTITATIVE: D-Dimer, Quant: 0.33 mcg/mL FEU (ref <0.50)

## 2019-11-24 LAB — RETICULOCYTES
ABS Retic: 61740 cells/uL (ref 20000–8000)
Retic Ct Pct: 1.4 %

## 2019-11-25 DIAGNOSIS — E039 Hypothyroidism, unspecified: Secondary | ICD-10-CM

## 2019-11-25 LAB — URINE CULTURE
MICRO NUMBER:: 1160326
SPECIMEN QUALITY:: ADEQUATE

## 2019-11-26 ENCOUNTER — Other Ambulatory Visit: Payer: Self-pay | Admitting: Primary Care

## 2019-11-26 DIAGNOSIS — N3001 Acute cystitis with hematuria: Secondary | ICD-10-CM

## 2019-11-26 MED ORDER — SULFAMETHOXAZOLE-TRIMETHOPRIM 800-160 MG PO TABS: 1.0000 | ORAL_TABLET | Freq: Two times a day (BID) | ORAL | 0 refills | Status: DC

## 2019-11-27 MED ORDER — LEVOTHYROXINE SODIUM 150 MCG PO TABS: ORAL_TABLET | ORAL | 0 refills | Status: DC

## 2019-12-12 ENCOUNTER — Other Ambulatory Visit: Payer: Self-pay

## 2019-12-12 DIAGNOSIS — I10 Essential (primary) hypertension: Secondary | ICD-10-CM | POA: Diagnosis not present

## 2019-12-12 DIAGNOSIS — Z87891 Personal history of nicotine dependence: Secondary | ICD-10-CM | POA: Insufficient documentation

## 2019-12-12 DIAGNOSIS — Z79899 Other long term (current) drug therapy: Secondary | ICD-10-CM | POA: Diagnosis not present

## 2019-12-12 DIAGNOSIS — R0789 Other chest pain: Secondary | ICD-10-CM | POA: Insufficient documentation

## 2019-12-12 DIAGNOSIS — R079 Chest pain, unspecified: Secondary | ICD-10-CM | POA: Diagnosis not present

## 2019-12-12 LAB — CBC
HCT: 36.6 % (ref 36.0–46.0)
Hemoglobin: 12 g/dL (ref 12.0–15.0)
MCH: 27.1 pg (ref 26.0–34.0)
MCHC: 32.8 g/dL (ref 30.0–36.0)
MCV: 82.6 fL (ref 80.0–100.0)
Platelets: 461 10*3/uL — ABNORMAL HIGH (ref 150–400)
RBC: 4.43 MIL/uL (ref 3.87–5.11)
RDW: 13.2 % (ref 11.5–15.5)
WBC: 5.5 10*3/uL (ref 4.0–10.5)
nRBC: 0 % (ref 0.0–0.2)

## 2019-12-12 NOTE — ED Triage Notes (Signed)
Pt in with co left sided chest pain that radiates into left arm and axilla. States also into left jaw. Has been treated for indigestion recently but states feels different.

## 2019-12-13 ENCOUNTER — Emergency Department
Admission: EM | Admit: 2019-12-13 | Discharge: 2019-12-13 | Disposition: A | Discharge status: Home / Self Care | Payer: 59 | Attending: Emergency Medicine | Admitting: Emergency Medicine

## 2019-12-13 ENCOUNTER — Encounter: Payer: Self-pay | Admitting: Internal Medicine

## 2019-12-13 ENCOUNTER — Telehealth: Payer: Self-pay | Admitting: Primary Care

## 2019-12-13 ENCOUNTER — Inpatient Hospital Stay: Payer: 59 | Attending: Internal Medicine | Admitting: Internal Medicine

## 2019-12-13 ENCOUNTER — Other Ambulatory Visit: Payer: Self-pay

## 2019-12-13 ENCOUNTER — Inpatient Hospital Stay: Payer: 59

## 2019-12-13 ENCOUNTER — Emergency Department: Payer: 59

## 2019-12-13 DIAGNOSIS — D473 Essential (hemorrhagic) thrombocythemia: Secondary | ICD-10-CM

## 2019-12-13 DIAGNOSIS — E039 Hypothyroidism, unspecified: Secondary | ICD-10-CM

## 2019-12-13 DIAGNOSIS — Z87891 Personal history of nicotine dependence: Secondary | ICD-10-CM | POA: Diagnosis not present

## 2019-12-13 DIAGNOSIS — G8929 Other chronic pain: Secondary | ICD-10-CM | POA: Diagnosis not present

## 2019-12-13 DIAGNOSIS — Z79899 Other long term (current) drug therapy: Secondary | ICD-10-CM | POA: Diagnosis not present

## 2019-12-13 DIAGNOSIS — M2550 Pain in unspecified joint: Secondary | ICD-10-CM

## 2019-12-13 DIAGNOSIS — Z7951 Long term (current) use of inhaled steroids: Secondary | ICD-10-CM | POA: Diagnosis not present

## 2019-12-13 DIAGNOSIS — Z87440 Personal history of urinary (tract) infections: Secondary | ICD-10-CM | POA: Diagnosis not present

## 2019-12-13 DIAGNOSIS — R7989 Other specified abnormal findings of blood chemistry: Secondary | ICD-10-CM | POA: Diagnosis not present

## 2019-12-13 DIAGNOSIS — D571 Sickle-cell disease without crisis: Secondary | ICD-10-CM

## 2019-12-13 DIAGNOSIS — D75839 Thrombocytosis, unspecified: Secondary | ICD-10-CM

## 2019-12-13 DIAGNOSIS — R12 Heartburn: Secondary | ICD-10-CM | POA: Diagnosis not present

## 2019-12-13 DIAGNOSIS — I10 Essential (primary) hypertension: Secondary | ICD-10-CM | POA: Diagnosis not present

## 2019-12-13 DIAGNOSIS — Z803 Family history of malignant neoplasm of breast: Secondary | ICD-10-CM

## 2019-12-13 DIAGNOSIS — M549 Dorsalgia, unspecified: Secondary | ICD-10-CM

## 2019-12-13 DIAGNOSIS — R0789 Other chest pain: Secondary | ICD-10-CM | POA: Diagnosis not present

## 2019-12-13 DIAGNOSIS — R5383 Other fatigue: Secondary | ICD-10-CM

## 2019-12-13 DIAGNOSIS — R079 Chest pain, unspecified: Secondary | ICD-10-CM | POA: Diagnosis not present

## 2019-12-13 DIAGNOSIS — Z8249 Family history of ischemic heart disease and other diseases of the circulatory system: Secondary | ICD-10-CM | POA: Diagnosis not present

## 2019-12-13 LAB — CBC WITH DIFFERENTIAL/PLATELET
Abs Immature Granulocytes: 0.01 10*3/uL (ref 0.00–0.07)
Basophils Absolute: 0.1 10*3/uL (ref 0.0–0.1)
Basophils Relative: 2 %
Eosinophils Absolute: 0.6 10*3/uL — ABNORMAL HIGH (ref 0.0–0.5)
Eosinophils Relative: 12 %
HCT: 37 % (ref 36.0–46.0)
Hemoglobin: 11.8 g/dL — ABNORMAL LOW (ref 12.0–15.0)
Immature Granulocytes: 0 %
Lymphocytes Relative: 36 %
Lymphs Abs: 1.7 10*3/uL (ref 0.7–4.0)
MCH: 26.8 pg (ref 26.0–34.0)
MCHC: 31.9 g/dL (ref 30.0–36.0)
MCV: 84.1 fL (ref 80.0–100.0)
Monocytes Absolute: 0.4 10*3/uL (ref 0.1–1.0)
Monocytes Relative: 9 %
Neutro Abs: 1.9 10*3/uL (ref 1.7–7.7)
Neutrophils Relative %: 41 %
Platelets: 432 10*3/uL — ABNORMAL HIGH (ref 150–400)
RBC: 4.4 MIL/uL (ref 3.87–5.11)
RDW: 13.2 % (ref 11.5–15.5)
WBC: 4.6 10*3/uL (ref 4.0–10.5)
nRBC: 0 % (ref 0.0–0.2)

## 2019-12-13 LAB — COMPREHENSIVE METABOLIC PANEL
ALT: 22 U/L (ref 0–44)
ALT: 23 U/L (ref 0–44)
AST: 20 U/L (ref 15–41)
AST: 21 U/L (ref 15–41)
Albumin: 4.3 g/dL (ref 3.5–5.0)
Albumin: 4.4 g/dL (ref 3.5–5.0)
Alkaline Phosphatase: 63 U/L (ref 38–126)
Alkaline Phosphatase: 64 U/L (ref 38–126)
Anion gap: 10 (ref 5–15)
Anion gap: 7 (ref 5–15)
BUN: 11 mg/dL (ref 6–20)
BUN: 12 mg/dL (ref 6–20)
CO2: 24 mmol/L (ref 22–32)
CO2: 24 mmol/L (ref 22–32)
Calcium: 9.2 mg/dL (ref 8.9–10.3)
Calcium: 9.2 mg/dL (ref 8.9–10.3)
Chloride: 106 mmol/L (ref 98–111)
Chloride: 109 mmol/L (ref 98–111)
Creatinine, Ser: 0.84 mg/dL (ref 0.44–1.00)
Creatinine, Ser: 0.87 mg/dL (ref 0.44–1.00)
GFR calc Af Amer: >60 mL/min (ref >60)
GFR calc Af Amer: >60 mL/min (ref >60)
GFR calc non Af Amer: >60 mL/min (ref >60)
GFR calc non Af Amer: >60 mL/min (ref >60)
Glucose, Bld: 120 mg/dL — ABNORMAL HIGH (ref 70–99)
Glucose, Bld: 99 mg/dL (ref 70–99)
Potassium: 3.5 mmol/L (ref 3.5–5.1)
Potassium: 3.9 mmol/L (ref 3.5–5.1)
Sodium: 140 mmol/L (ref 135–145)
Sodium: 140 mmol/L (ref 135–145)
Total Bilirubin: 0.3 mg/dL (ref 0.3–1.2)
Total Bilirubin: 0.5 mg/dL (ref 0.3–1.2)
Total Protein: 8 g/dL (ref 6.5–8.1)
Total Protein: 8.3 g/dL — ABNORMAL HIGH (ref 6.5–8.1)

## 2019-12-13 LAB — RETICULOCYTES
Immature Retic Fract: 7.9 % (ref 2.3–15.9)
RBC.: 4.36 MIL/uL (ref 3.87–5.11)
Retic Count, Absolute: 56.2 10*3/uL (ref 19.0–186.0)
Retic Ct Pct: 1.3 % (ref 0.4–3.1)

## 2019-12-13 LAB — C-REACTIVE PROTEIN: CRP: 0.7 mg/dL (ref <1.0)

## 2019-12-13 LAB — TROPONIN I (HIGH SENSITIVITY)
Troponin I (High Sensitivity): <2 ng/L (ref <18)
Troponin I (High Sensitivity): <2 ng/L (ref <18)

## 2019-12-13 LAB — LACTATE DEHYDROGENASE: LDH: 151 U/L (ref 98–192)

## 2019-12-13 NOTE — Assessment & Plan Note (Addendum)
#  Thrombocytosis-chronic 400- 460s.  Asymptomatic.  The etiology is unclear-more likely reactive than primary bone marrow process.  Recommend checking CBC CMP LDH-JAK2/calr/mpl mutations.  Discussed regarding bone marrow; however hold at this time.  # "Sickle cell disease"-as per patient's history.  No records available.  Based upon review of recent blood work no clinical signs and symptoms of sickle cell disease.  Hemoglobin is stable around 12.  Will check LDH/hemoglobinopathy evaluation.  #Chronic joint pains morning stiffness-question rheumatologic issues.  Check CRP.  Recommend rheumatology evaluation/defer to PCP.  Thank you Ms.Clark NP for allowing me to participate in the care of your pleasant patient. Please do not hesitate to contact me with questions or concerns in the interim.  # DISPOSITION: will call/mychart # labs today # follow up TBD- Dr.B  Ml; Carlis Abbott
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
## 2019-12-13 NOTE — Telephone Encounter (Signed)
I reviewed notes from the oncologist and don't see mention of these recommendations. It looks like she is to follow back up with the oncologist in 2-3 weeks. We can also discuss during her visit in early January 2021.  Will also copy oncology as FYI and for correction if I am mistake.

## 2019-12-13 NOTE — Discharge Instructions (Addendum)
Return to the ER for new, worsening, or persistent severe chest pain, weakness or numbness in the arm or leg, difficulty breathing worse than your baseline, or any other new or worsening symptoms that concern you.  Follow-up tomorrow as scheduled, as well as with your primary care doctor within the next several weeks.
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
## 2019-12-13 NOTE — Telephone Encounter (Signed)
Patient had an appointment with the oncologist today .  They advised her to contact her primary, that she needed to be seen my a rheumatologist instead. And see if we could refer her to a location in [place]

## 2019-12-14 LAB — HEMOGLOBINOPATHY EVALUATION
Hgb A2 Quant: 3.7 % — ABNORMAL HIGH (ref 1.8–3.2)
Hgb A: 60 % — ABNORMAL LOW (ref 96.4–98.8)
Hgb C: 0 %
Hgb F Quant: 0 % (ref 0.0–2.0)
Hgb S Quant: 36.3 % — ABNORMAL HIGH
Hgb Variant: 0 %

## 2019-12-14 NOTE — Telephone Encounter (Signed)
Spoken and notified patient of Kate Clark's comments. Patient verbalized understanding.  

## 2019-12-14 NOTE — Telephone Encounter (Signed)
Hi [name]- I felt pt's platelets are likely elevated secondary to reactive process.  I am not suspicious of any primary bone marrow process; however work-up is pending.  I will update you if any of her work-up comes significantly abnormal.   Given her joint pains/possible inflammation-recommend evaluation with rheumatology.   Happy holidays! Thanks for the referral [facility]

## 2019-12-14 NOTE — Telephone Encounter (Addendum)
Hi Dr. Rogue Bussing,   Thank you for your message! I will be seeing patient back in the office in early January 2021 to discuss. Thank you for your thorough evaluation, I appreciate it!  Happy Holidays to you as well! Take care!

## 2019-12-18 ENCOUNTER — Encounter: Payer: Self-pay | Admitting: Primary Care

## 2019-12-20 LAB — MPL MUTATION ANALYSIS

## 2019-12-20 LAB — JAK2 GENOTYPR

## 2019-12-21 LAB — CALRETICULIN (CALR) MUTATION ANALYSIS

## 2019-12-28 ENCOUNTER — Other Ambulatory Visit: Payer: Self-pay | Admitting: Primary Care

## 2019-12-28 ENCOUNTER — Encounter: Payer: Self-pay | Admitting: Primary Care

## 2019-12-28 ENCOUNTER — Other Ambulatory Visit: Payer: Self-pay

## 2019-12-28 ENCOUNTER — Ambulatory Visit (INDEPENDENT_AMBULATORY_CARE_PROVIDER_SITE_OTHER): Payer: 59 | Admitting: Primary Care

## 2019-12-28 VITALS — BP 130/86 | HR 84 | Temp 96.1°F | Ht 64.0 in | Wt 204.2 lb

## 2019-12-28 DIAGNOSIS — M2550 Pain in unspecified joint: Secondary | ICD-10-CM | POA: Diagnosis not present

## 2019-12-28 DIAGNOSIS — E559 Vitamin D deficiency, unspecified: Secondary | ICD-10-CM | POA: Diagnosis not present

## 2019-12-28 DIAGNOSIS — R222 Localized swelling, mass and lump, trunk: Secondary | ICD-10-CM

## 2019-12-28 DIAGNOSIS — R229 Localized swelling, mass and lump, unspecified: Secondary | ICD-10-CM

## 2019-12-28 DIAGNOSIS — F32A Depression, unspecified: Secondary | ICD-10-CM | POA: Insufficient documentation

## 2019-12-28 DIAGNOSIS — D573 Sickle-cell trait: Secondary | ICD-10-CM

## 2019-12-28 DIAGNOSIS — E039 Hypothyroidism, unspecified: Secondary | ICD-10-CM

## 2019-12-28 DIAGNOSIS — F419 Anxiety disorder, unspecified: Secondary | ICD-10-CM | POA: Diagnosis not present

## 2019-12-28 DIAGNOSIS — R319 Hematuria, unspecified: Secondary | ICD-10-CM | POA: Diagnosis not present

## 2019-12-28 DIAGNOSIS — R350 Frequency of micturition: Secondary | ICD-10-CM | POA: Diagnosis not present

## 2019-12-28 HISTORY — DX: Localized swelling, mass and lump, unspecified: R22.9

## 2019-12-28 LAB — TSH: TSH: 5.6 u[IU]/mL — ABNORMAL HIGH (ref 0.35–4.50)

## 2019-12-28 LAB — POC URINALSYSI DIPSTICK (AUTOMATED)
Bilirubin, UA: NEGATIVE
Glucose, UA: NEGATIVE
Ketones, UA: NEGATIVE
Leukocytes, UA: NEGATIVE
Nitrite, UA: NEGATIVE
Protein, UA: NEGATIVE
Spec Grav, UA: 1.02 (ref 1.010–1.025)
Urobilinogen, UA: 0.2 E.U./dL
pH, UA: 5.5 (ref 5.0–8.0)

## 2019-12-28 LAB — SEDIMENTATION RATE: Sed Rate: 21 mm/hr (ref 0–30)

## 2019-12-28 LAB — HEMOGLOBIN A1C: Hgb A1c MFr Bld: 5.6 % (ref 4.6–6.5)

## 2019-12-28 LAB — VITAMIN D 25 HYDROXY (VIT D DEFICIENCY, FRACTURES): VITD: 16.09 ng/mL — ABNORMAL LOW (ref 30.00–100.00)

## 2019-12-28 LAB — C-REACTIVE PROTEIN: CRP: <1 mg/dL (ref 0.5–20.0)

## 2019-12-28 NOTE — Assessment & Plan Note (Signed)
Superficial and subcutaneous.  Appears and feels to be cyst. Check ultrasound for further evaluation.  No obvious infection/abcess noted.

## 2019-12-28 NOTE — Assessment & Plan Note (Signed)
Microscopic. Noted on UA today, trace. Culture sent. Consider ongoing monitoring.

## 2019-12-28 NOTE — Assessment & Plan Note (Addendum)
Since late November 2020. Some improvement with UTI treatment in early December 2020.  UA today with trace blood, otherwise negative. Culture sent.  Check A1C.

## 2019-12-28 NOTE — Patient Instructions (Addendum)
Stop by the lab prior to leaving today. I will notify you of your results once received.   Be sure to take your levothyroxine (thyroid medication) every morning on an empty stomach with water only. No food or other medications for 30 minutes. No heartburn medication, iron pills, calcium, vitamin D, or magnesium pills within four hours of taking levothyroxine.   You will be contacted regarding your ultrasound.  Please let us know if you have not been contacted within two weeks.   It was a pleasure to see you today!

## 2019-12-28 NOTE — Assessment & Plan Note (Signed)
Compliant to 2000 units daily, repeat vitamin D pending.

## 2019-12-28 NOTE — Assessment & Plan Note (Signed)
Diagnosed officially per hematology.

## 2019-12-28 NOTE — Assessment & Plan Note (Signed)
Chronic, recently more acute and situational. Not daily. Symptoms are sporadic overall. Discussed that if symptoms began daily and/or if they interfere with day to day life then she needs to call. Continue to monitor for now.  Discoursed use of lorazepam.
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
## 2019-12-28 NOTE — Assessment & Plan Note (Signed)
Chronic to numerous joints. Family history of SLE in two direct family members.  Check ANA, Sed rate, CRP, CCP, RF.

## 2019-12-28 NOTE — Assessment & Plan Note (Signed)
Repeat TSH pending. She is more compliant to her levothyroxine and is taking appropriately.

## 2019-12-29 LAB — URINE CULTURE
MICRO NUMBER:: 10018088
Result:: NO GROWTH
SPECIMEN QUALITY:: ADEQUATE

## 2019-12-29 LAB — RHEUMATOID FACTOR: Rheumatoid fact SerPl-aCnc: <14 IU/mL (ref <14)

## 2019-12-29 LAB — CYCLIC CITRUL PEPTIDE ANTIBODY, IGG: Cyclic Citrullin Peptide Ab: <16 UNITS

## 2019-12-29 LAB — ANA: Anti Nuclear Antibody (ANA): NEGATIVE

## 2020-01-05 ENCOUNTER — Ambulatory Visit
Admission: RE | Admit: 2020-01-05 | Discharge: 2020-01-05 | Disposition: A | Discharge status: Home / Self Care | Payer: 59 | Source: Ambulatory Visit | Attending: Primary Care | Admitting: Primary Care

## 2020-01-05 ENCOUNTER — Other Ambulatory Visit: Payer: Self-pay | Admitting: Primary Care

## 2020-01-05 ENCOUNTER — Other Ambulatory Visit: Payer: Self-pay

## 2020-01-05 DIAGNOSIS — R229 Localized swelling, mass and lump, unspecified: Secondary | ICD-10-CM | POA: Insufficient documentation

## 2020-01-05 DIAGNOSIS — R222 Localized swelling, mass and lump, trunk: Secondary | ICD-10-CM | POA: Diagnosis not present

## 2020-01-05 DIAGNOSIS — R102 Pelvic and perineal pain: Secondary | ICD-10-CM | POA: Diagnosis not present

## 2020-01-12 NOTE — Addendum Note (Signed)
Addended by: Pleas Koch on: 01/12/2020 05:00 PM   Modules accepted: Orders

## 2020-01-14 ENCOUNTER — Ambulatory Visit
Admission: RE | Admit: 2020-01-14 | Discharge: 2020-01-14 | Disposition: A | Discharge status: Home / Self Care | Payer: 59 | Source: Ambulatory Visit | Attending: Primary Care | Admitting: Primary Care

## 2020-01-14 ENCOUNTER — Other Ambulatory Visit: Payer: Self-pay

## 2020-01-14 ENCOUNTER — Other Ambulatory Visit: Payer: Self-pay | Admitting: Primary Care

## 2020-01-14 ENCOUNTER — Telehealth: Payer: Self-pay | Admitting: Primary Care

## 2020-01-14 DIAGNOSIS — R229 Localized swelling, mass and lump, unspecified: Secondary | ICD-10-CM
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
## 2020-01-15 ENCOUNTER — Other Ambulatory Visit (INDEPENDENT_AMBULATORY_CARE_PROVIDER_SITE_OTHER): Payer: 59

## 2020-01-15 ENCOUNTER — Other Ambulatory Visit: Payer: Self-pay | Admitting: Primary Care

## 2020-01-15 ENCOUNTER — Ambulatory Visit (INDEPENDENT_AMBULATORY_CARE_PROVIDER_SITE_OTHER)
Admission: RE | Admit: 2020-01-15 | Discharge: 2020-01-15 | Disposition: A | Discharge status: Home / Self Care | Payer: 59 | Source: Ambulatory Visit | Attending: Primary Care | Admitting: Primary Care

## 2020-01-15 ENCOUNTER — Ambulatory Visit: Payer: 59

## 2020-01-15 DIAGNOSIS — M795 Residual foreign body in soft tissue: Secondary | ICD-10-CM | POA: Diagnosis not present

## 2020-01-15 DIAGNOSIS — Z87828 Personal history of other (healed) physical injury and trauma: Secondary | ICD-10-CM

## 2020-01-15 DIAGNOSIS — R229 Localized swelling, mass and lump, unspecified: Secondary | ICD-10-CM | POA: Diagnosis not present

## 2020-01-15 LAB — CBC WITH DIFFERENTIAL/PLATELET
Basophils Absolute: 0 10*3/uL (ref 0.0–0.1)
Basophils Relative: 0.8 % (ref 0.0–3.0)
Eosinophils Absolute: 0.7 10*3/uL (ref 0.0–0.7)
Eosinophils Relative: 12.8 % — ABNORMAL HIGH (ref 0.0–5.0)
HCT: 37.1 % (ref 36.0–46.0)
Hemoglobin: 12.1 g/dL (ref 12.0–15.0)
Lymphocytes Relative: 34.9 % (ref 12.0–46.0)
Lymphs Abs: 2 10*3/uL (ref 0.7–4.0)
MCHC: 32.6 g/dL (ref 30.0–36.0)
MCV: 84 fl (ref 78.0–100.0)
Monocytes Absolute: 0.3 10*3/uL (ref 0.1–1.0)
Monocytes Relative: 5.3 % (ref 3.0–12.0)
Neutro Abs: 2.6 10*3/uL (ref 1.4–7.7)
Neutrophils Relative %: 46.2 % (ref 43.0–77.0)
Platelets: 466 10*3/uL — ABNORMAL HIGH (ref 150.0–400.0)
RBC: 4.42 Mil/uL (ref 3.87–5.11)
RDW: 13.7 % (ref 11.5–15.5)
WBC: 5.7 10*3/uL (ref 4.0–10.5)

## 2020-01-15 LAB — BASIC METABOLIC PANEL
BUN: 14 mg/dL (ref 6–23)
CO2: 25 mEq/L (ref 19–32)
Calcium: 9.7 mg/dL (ref 8.4–10.5)
Chloride: 105 mEq/L (ref 96–112)
Creatinine, Ser: 0.86 mg/dL (ref 0.40–1.20)
GFR: 83.98 mL/min (ref >60.00)
Glucose, Bld: 115 mg/dL — ABNORMAL HIGH (ref 70–99)
Potassium: 3.5 mEq/L (ref 3.5–5.1)
Sodium: 141 mEq/L (ref 135–145)

## 2020-01-15 LAB — SEDIMENTATION RATE: Sed Rate: 47 mm/hr — ABNORMAL HIGH (ref 0–30)

## 2020-01-15 NOTE — Telephone Encounter (Signed)
Noted  

## 2020-01-15 NOTE — Telephone Encounter (Signed)
Patient will come to our office today for 2 view xrays and labs. MRI rescheduled for Wednesday 01/17/20 at 12:00pm. Patient notified.

## 2020-01-16 ENCOUNTER — Encounter: Payer: Self-pay | Admitting: Primary Care

## 2020-01-16 ENCOUNTER — Other Ambulatory Visit: Payer: Self-pay

## 2020-01-16 ENCOUNTER — Ambulatory Visit (INDEPENDENT_AMBULATORY_CARE_PROVIDER_SITE_OTHER): Payer: 59 | Admitting: Primary Care

## 2020-01-16 VITALS — BP 130/80 | HR 90 | Temp 96.4°F | Ht 64.0 in | Wt 198.5 lb

## 2020-01-16 DIAGNOSIS — R229 Localized swelling, mass and lump, unspecified: Secondary | ICD-10-CM

## 2020-01-16 DIAGNOSIS — M5442 Lumbago with sciatica, left side: Secondary | ICD-10-CM

## 2020-01-16 DIAGNOSIS — E039 Hypothyroidism, unspecified: Secondary | ICD-10-CM | POA: Diagnosis not present

## 2020-01-16 MED ORDER — PREDNISONE 20 MG PO TABS: ORAL_TABLET | ORAL | 0 refills | Status: DC

## 2020-01-16 MED ORDER — KETOROLAC TROMETHAMINE 60 MG/2ML IM SOLN
60.0000 mg | Freq: Once | INTRAMUSCULAR | Status: AC
  Administered 2020-01-16: 60 mg via INTRAMUSCULAR

## 2020-01-16 MED ORDER — LEVOTHYROXINE SODIUM 150 MCG PO TABS: ORAL_TABLET | ORAL | 0 refills | Status: DC

## 2020-01-16 MED ORDER — TIZANIDINE HCL 4 MG PO TABS: 4.0000 mg | ORAL_TABLET | Freq: Three times a day (TID) | ORAL | 0 refills | Status: DC | PRN
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
## 2020-01-16 NOTE — Assessment & Plan Note (Signed)
Acute for the last several weeks, now with what sounds like sciatica. No alarm signs, no signs of cauda equina syndrome.  IM Toradol 60 mg provided today. Rx for prednisone course sent to pharmacy. Rx for Tizanidine sent to pharmacy. Start Tylenol routinely.   She will update.

## 2020-01-16 NOTE — Assessment & Plan Note (Signed)
Pelvic MRI pending.

## 2020-01-16 NOTE — Assessment & Plan Note (Signed)
She is taking levothyroxine correctly, due for repeat TSH in one month. Refill sent to pharmacy.

## 2020-01-16 NOTE — Patient Instructions (Addendum)
Start prednisone tablets for back pain. Take 2 tablets daily for four days, then 1 tablet daily for four days.  Start Tylenol 1000 mg and take this every 8 hours for the next 5 days.   Complete the MRI as scheduled.   It was a pleasure to see you today!

## 2020-01-16 NOTE — Addendum Note (Signed)
Addended by: Jacqualin Combes on: 01/16/2020 09:58 AM   Modules accepted: Orders

## 2020-01-17 ENCOUNTER — Ambulatory Visit
Admission: RE | Admit: 2020-01-17 | Discharge: 2020-01-17 | Disposition: A | Discharge status: Home / Self Care | Payer: 59 | Source: Ambulatory Visit | Attending: Primary Care | Admitting: Primary Care

## 2020-01-17 ENCOUNTER — Other Ambulatory Visit: Payer: 59

## 2020-01-17 DIAGNOSIS — Z87828 Personal history of other (healed) physical injury and trauma: Secondary | ICD-10-CM

## 2020-01-17 DIAGNOSIS — S70351A Superficial foreign body, right thigh, initial encounter: Secondary | ICD-10-CM | POA: Diagnosis not present

## 2020-01-17 DIAGNOSIS — R2242 Localized swelling, mass and lump, left lower limb: Secondary | ICD-10-CM | POA: Diagnosis not present

## 2020-01-17 DIAGNOSIS — R229 Localized swelling, mass and lump, unspecified: Secondary | ICD-10-CM | POA: Diagnosis not present

## 2020-01-17 MED ORDER — GADOBUTROL 1 MMOL/ML IV SOLN
9.0000 mL | Freq: Once | INTRAVENOUS | Status: AC | PRN
  Administered 2020-01-17: 9 mL via INTRAVENOUS

## 2020-01-30 ENCOUNTER — Other Ambulatory Visit: Payer: Self-pay | Admitting: General Surgery

## 2020-01-30 DIAGNOSIS — R222 Localized swelling, mass and lump, trunk: Secondary | ICD-10-CM | POA: Diagnosis not present

## 2020-01-30 DIAGNOSIS — Z1211 Encounter for screening for malignant neoplasm of colon: Secondary | ICD-10-CM | POA: Diagnosis not present

## 2020-01-31 ENCOUNTER — Other Ambulatory Visit: Payer: Self-pay | Admitting: General Surgery

## 2020-01-31 LAB — SURGICAL PATHOLOGY

## 2020-03-11 ENCOUNTER — Other Ambulatory Visit: Payer: Self-pay

## 2020-03-11 ENCOUNTER — Other Ambulatory Visit
Admission: RE | Admit: 2020-03-11 | Discharge: 2020-03-11 | Disposition: A | Discharge status: Home / Self Care | Payer: 59 | Source: Ambulatory Visit | Attending: General Surgery | Admitting: General Surgery

## 2020-03-11 DIAGNOSIS — Z01812 Encounter for preprocedural laboratory examination: Secondary | ICD-10-CM | POA: Insufficient documentation

## 2020-03-11 DIAGNOSIS — Z20822 Contact with and (suspected) exposure to covid-19: Secondary | ICD-10-CM | POA: Diagnosis not present

## 2020-03-12 LAB — SARS CORONAVIRUS 2 (TAT 6-24 HRS): SARS Coronavirus 2: NEGATIVE

## 2020-03-13 ENCOUNTER — Other Ambulatory Visit: Payer: Self-pay

## 2020-03-13 ENCOUNTER — Ambulatory Visit
Admission: RE | Admit: 2020-03-13 | Discharge: 2020-03-13 | Disposition: A | Discharge status: Home / Self Care | Payer: 59 | Attending: General Surgery | Admitting: General Surgery

## 2020-03-13 ENCOUNTER — Ambulatory Visit: Payer: 59 | Admitting: Certified Registered Nurse Anesthetist

## 2020-03-13 ENCOUNTER — Encounter
Admission: RE | Disposition: A | Discharge status: Home / Self Care | Payer: Self-pay | Source: Home / Self Care | Attending: General Surgery

## 2020-03-13 DIAGNOSIS — I10 Essential (primary) hypertension: Secondary | ICD-10-CM | POA: Diagnosis not present

## 2020-03-13 DIAGNOSIS — Z7951 Long term (current) use of inhaled steroids: Secondary | ICD-10-CM | POA: Insufficient documentation

## 2020-03-13 DIAGNOSIS — E039 Hypothyroidism, unspecified: Secondary | ICD-10-CM | POA: Diagnosis not present

## 2020-03-13 DIAGNOSIS — Z87892 Personal history of anaphylaxis: Secondary | ICD-10-CM | POA: Insufficient documentation

## 2020-03-13 DIAGNOSIS — Z87891 Personal history of nicotine dependence: Secondary | ICD-10-CM | POA: Diagnosis not present

## 2020-03-13 DIAGNOSIS — D573 Sickle-cell trait: Secondary | ICD-10-CM | POA: Diagnosis not present

## 2020-03-13 DIAGNOSIS — Z1211 Encounter for screening for malignant neoplasm of colon: Secondary | ICD-10-CM | POA: Insufficient documentation

## 2020-03-13 DIAGNOSIS — Z91018 Allergy to other foods: Secondary | ICD-10-CM | POA: Insufficient documentation

## 2020-03-13 DIAGNOSIS — Z881 Allergy status to other antibiotic agents status: Secondary | ICD-10-CM | POA: Insufficient documentation

## 2020-03-13 DIAGNOSIS — Z79899 Other long term (current) drug therapy: Secondary | ICD-10-CM | POA: Insufficient documentation

## 2020-03-13 DIAGNOSIS — Z79890 Hormone replacement therapy (postmenopausal): Secondary | ICD-10-CM | POA: Diagnosis not present

## 2020-03-13 HISTORY — PX: COLONOSCOPY WITH PROPOFOL: SHX5780

## 2020-03-13 SURGERY — COLONOSCOPY WITH PROPOFOL
Anesthesia: General

## 2020-03-13 MED ORDER — PROPOFOL 500 MG/50ML IV EMUL
INTRAVENOUS | Status: DC | PRN
  Administered 2020-03-13: 160 ug/kg/min via INTRAVENOUS

## 2020-03-13 MED ORDER — SODIUM CHLORIDE 0.9 % IV SOLN
INTRAVENOUS | Status: DC
  Administered 2020-03-13: 1000 mL via INTRAVENOUS

## 2020-03-13 MED ORDER — PROPOFOL 10 MG/ML IV BOLUS
INTRAVENOUS | Status: DC | PRN
  Administered 2020-03-13: 80 mg via INTRAVENOUS

## 2020-03-13 MED ORDER — PROPOFOL 500 MG/50ML IV EMUL
INTRAVENOUS | Status: AC
  Filled 2020-03-13: qty 200

## 2020-03-13 MED ORDER — GLYCOPYRROLATE 0.2 MG/ML IJ SOLN
INTRAMUSCULAR | Status: DC | PRN
  Administered 2020-03-13: .2 mg via INTRAVENOUS

## 2020-03-13 MED ORDER — LIDOCAINE HCL (PF) 2 % IJ SOLN
INTRAMUSCULAR | Status: AC
  Filled 2020-03-13: qty 10

## 2020-03-13 MED ORDER — PHENYLEPHRINE HCL (PRESSORS) 10 MG/ML IV SOLN
INTRAVENOUS | Status: AC
  Filled 2020-03-13: qty 1

## 2020-03-13 MED ORDER — GLYCOPYRROLATE 0.2 MG/ML IJ SOLN
INTRAMUSCULAR | Status: AC
  Filled 2020-03-13: qty 1
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
## 2020-03-13 NOTE — Anesthesia Procedure Notes (Signed)
Performed by: Hamza Empson, CRNA Pre-anesthesia Checklist: Patient identified, Emergency Drugs available, Suction available, Patient being monitored and Timeout performed Patient Re-evaluated:Patient Re-evaluated prior to induction Oxygen Delivery Method: Nasal cannula Induction Type: IV induction       

## 2020-03-13 NOTE — Anesthesia Postprocedure Evaluation (Signed)
Anesthesia Post Note  Patient: Lisa Mullen  Procedure(s) Performed: COLONOSCOPY WITH PROPOFOL (N/A )  Patient location during evaluation: PACU Anesthesia Type: General Level of consciousness: awake and alert Pain management: pain level controlled Vital Signs Assessment: post-procedure vital signs reviewed and stable Respiratory status: spontaneous breathing, nonlabored ventilation and respiratory function stable Cardiovascular status: blood pressure returned to baseline and stable Postop Assessment: no apparent nausea or vomiting Anesthetic complications: no     Last Vitals:  Vitals:   03/13/20 0810 03/13/20 0820  BP: 131/80 (!) 142/88  Pulse: 66 (!) 134  Resp: 19 16  Temp:    SpO2: 100% 97%    Last Pain:  Vitals:   03/13/20 0820  TempSrc:   PainSc: 0-No pain                 Tera Mater

## 2020-03-13 NOTE — Transfer of Care (Signed)
Immediate Anesthesia Transfer of Care Note  Patient: Lisa Mullen  Procedure(s) Performed: COLONOSCOPY WITH PROPOFOL (N/A )  Patient Location: PACU  Anesthesia Type:General  Level of Consciousness: awake and alert   Airway & Oxygen Therapy: Patient Spontanous Breathing and Patient connected to nasal cannula oxygen  Post-op Assessment: Report given to RN and Post -op Vital signs reviewed and stable  Post vital signs: Reviewed and stable  Last Vitals:  Vitals Value Taken Time  BP 115/70 03/13/20 0753  Temp 36.1 C 03/13/20 0753  Pulse 79 03/13/20 0753  Resp 18 03/13/20 0753  SpO2 98 % 03/13/20 0753  Vitals shown include unvalidated device data.  Last Pain:  Vitals:   03/13/20 0753  TempSrc:   PainSc: Asleep         Complications: No apparent anesthesia complications
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
## 2020-03-13 NOTE — Op Note (Signed)
[facility] Patient Name: Lisa Mullen Procedure Date: 03/13/2020 7:15 AM MRN: [id number] Account #: [id number] Date of Birth: 02-Oct-1966 Admit Type: Outpatient Age: 54 Room: [facility] ENDO ROOM 1 Gender: Female Note Status: Finalized Procedure:             Colonoscopy Indications:           Screening for colorectal malignant neoplasm Providers:             Robert Bellow, MD Referring MD:          Pleas Koch (Referring MD) Medicines:             Monitored Anesthesia Care Complications:         No immediate complications. Procedure:             Pre-Anesthesia Assessment:                        - Prior to the procedure, a History and Physical was                         performed, and patient medications, allergies and                         sensitivities were reviewed. The patient's tolerance                         of previous anesthesia was reviewed.                        - The risks and benefits of the procedure and the                         sedation options and risks were discussed with the                         patient. All questions were answered and informed                         consent was obtained.                        After obtaining informed consent, the colonoscope was                         passed under direct vision. Throughout the procedure,                         the patient's blood pressure, pulse, and oxygen                         saturations were monitored continuously. The                         Colonoscope was introduced through the anus and                         advanced to the the cecum, identified by appendiceal                         orifice and ileocecal  valve. The colonoscopy was                         somewhat difficult due to significant looping. The                         patient tolerated the procedure well. The quality of                         the bowel preparation was  excellent. Findings:      The entire examined colon appeared normal on direct and retroflexion       views. Impression:            - The entire examined colon is normal on direct and                         retroflexion views.                        - No specimens collected. Recommendation:        - Repeat colonoscopy in 10 years for screening                         purposes. Procedure Code(s):     --- Professional ---                        [phone], Colonoscopy, flexible; diagnostic, including                         collection of specimen(s) by brushing or washing, when                         performed (separate procedure) Diagnosis Code(s):     --- Professional ---                        Z12.11, Encounter for screening for malignant neoplasm                         of colon CPT copyright 2019 American Medical Association. All rights reserved. The codes documented in this report are preliminary and upon coder review may  be revised to meet current compliance requirements. Robert Bellow, MD 03/13/2020 7:49:43 AM This report has been signed electronically. Number of Addenda: 0 Note Initiated On: 03/13/2020 7:15 AM Scope Withdrawal Time: 0 hours 8 minutes 25 seconds  Total Procedure Duration: 0 hours 17 minutes 52 seconds  Estimated Blood Loss:  Estimated blood loss: none.      [facility]

## 2020-03-14 ENCOUNTER — Encounter: Payer: Self-pay | Admitting: *Deleted

## 2020-05-16 ENCOUNTER — Ambulatory Visit (INDEPENDENT_AMBULATORY_CARE_PROVIDER_SITE_OTHER): Payer: 59 | Admitting: Family Medicine

## 2020-05-16 ENCOUNTER — Other Ambulatory Visit: Payer: Self-pay

## 2020-05-16 ENCOUNTER — Encounter: Payer: Self-pay | Admitting: Family Medicine

## 2020-05-16 VITALS — BP 130/84 | HR 88 | Temp 97.8°F | Ht 64.0 in | Wt 200.5 lb

## 2020-05-16 DIAGNOSIS — R319 Hematuria, unspecified: Secondary | ICD-10-CM | POA: Diagnosis not present

## 2020-05-16 DIAGNOSIS — J309 Allergic rhinitis, unspecified: Secondary | ICD-10-CM | POA: Diagnosis not present

## 2020-05-16 DIAGNOSIS — D696 Thrombocytopenia, unspecified: Secondary | ICD-10-CM | POA: Diagnosis not present

## 2020-05-16 DIAGNOSIS — R102 Pelvic and perineal pain: Secondary | ICD-10-CM | POA: Diagnosis not present

## 2020-05-16 LAB — POC URINALSYSI DIPSTICK (AUTOMATED)
Bilirubin, UA: NEGATIVE
Glucose, UA: NEGATIVE
Ketones, UA: NEGATIVE
Leukocytes, UA: NEGATIVE
Nitrite, UA: NEGATIVE
Protein, UA: POSITIVE — AB
Spec Grav, UA: 1.015 (ref 1.010–1.025)
Urobilinogen, UA: 0.2 E.U./dL
pH, UA: 6 (ref 5.0–8.0)

## 2020-05-16 MED ORDER — FLUTICASONE PROPIONATE 50 MCG/ACT NA SUSP: 1.0000 | Freq: Two times a day (BID) | NASAL | 3 refills | Status: DC | PRN

## 2020-05-16 NOTE — Patient Instructions (Signed)
We will call to set up CT scan. Please stop at the lab to have labs drawn. If severe pain or fever.Marland Kitchen go to ER.

## 2020-05-16 NOTE — Progress Notes (Signed)
Chief Complaint  Patient presents with  . Abdominal Pain  . Hematuria    History of Present Illness: HPI    54 year old female presents with new onset abdominal pain and hematuria.   She reports  Sudden onset  Intermittent suprapubic pain/pressure and pain in right lateral abdomen sharp and intermittent.  When urinated with strong force and saw blood in urine.  No dysuria Somewhat urgent.. she holds her urine at work.  chronic low back pain.  Has history of blood in urine ( she has noted this off and on  Several time each month in last year)  no fever.  S/P hysterectomy.  No recent sexual activity.   Father on dialysis not sure cause.    No on blood thinners.  This visit occurred during the SARS-CoV-2 public health emergency.  Safety protocols were in place, including screening questions prior to the visit, additional usage of staff PPE, and extensive cleaning of exam room while observing appropriate contact time as indicated for disinfecting solutions.   COVID 19 screen:  No recent travel or known exposure to COVID19 The patient denies respiratory symptoms of COVID 19 at this time. The importance of social distancing was discussed today.     Review of Systems  Constitutional: Negative for chills and fever.  HENT: Negative for congestion and ear pain.   Eyes: Negative for pain and redness.  Respiratory: Negative for cough and shortness of breath.   Cardiovascular: Negative for chest pain, palpitations and leg swelling.  Gastrointestinal: Positive for abdominal pain. Negative for blood in stool, constipation, diarrhea, nausea and vomiting.  Genitourinary: Positive for hematuria. Negative for dysuria and flank pain.  Musculoskeletal: Positive for back pain. Negative for falls and myalgias.  Skin: Negative for rash.  Neurological: Negative for dizziness.  Psychiatric/Behavioral: Negative for depression. The patient is not nervous/anxious.       Past Medical History:   Diagnosis Date  . Hypertension   . Sickle cell trait ([place])   . Thyroid disease     reports that she quit smoking about 5 years ago. She has never used smokeless tobacco. She reports that she does not drink alcohol or use drugs.   Current Outpatient Medications:  .  albuterol (VENTOLIN HFA) 108 (90 Base) MCG/ACT inhaler, Inhale 2 puffs into the lungs every 4 (four) hours as needed for wheezing or shortness of breath., Disp: 18 g, Rfl: 0 .  fluticasone (FLONASE) 50 MCG/ACT nasal spray, Place 1 spray into both nostrils 2 (two) times daily as needed for allergies or rhinitis., Disp: 16 g, Rfl: 3 .  fluticasone (FLOVENT HFA) 110 MCG/ACT inhaler, Inhale 1 puff into the lungs 2 (two) times daily. Rinse mouth after use., Disp: 1 Inhaler, Rfl: 2 .  levothyroxine (SYNTHROID) 150 MCG tablet, Take 1 tablet by mouth every morning on an empty stomach with water only.  No food or other medications for 30 minutes., Disp: 90 tablet, Rfl: 0 .  amlodipine-olmesartan (AZOR) 10-20 MG tablet, Take 1 tablet by mouth daily. (Patient not taking: Reported on 05/16/2020), Disp: 90 tablet, Rfl: 1   Observations/Objective: Blood pressure 130/84, pulse 88, temperature 97.8 F (36.6 C), temperature source Temporal, height 5\' 4"  (1.626 m), weight 200 lb 8 oz (90.9 kg), SpO2 98 %.  Physical Exam Constitutional:      General: She is not in acute distress.    Appearance: Normal appearance. She is well-developed. She is not ill-appearing or toxic-appearing.  HENT:     Head: Normocephalic.  Right Ear: Hearing, tympanic membrane, ear canal and external ear normal. Tympanic membrane is not erythematous, retracted or bulging.     Left Ear: Hearing, tympanic membrane, ear canal and external ear normal. Tympanic membrane is not erythematous, retracted or bulging.     Nose: No mucosal edema or rhinorrhea.     Right Sinus: No maxillary sinus tenderness or frontal sinus tenderness.     Left Sinus: No maxillary sinus tenderness  or frontal sinus tenderness.     Mouth/Throat:     Pharynx: Uvula midline.  Eyes:     General: Lids are normal. Lids are everted, no foreign bodies appreciated.     Conjunctiva/sclera: Conjunctivae normal.     Pupils: Pupils are equal, round, and reactive to light.  Neck:     Thyroid: No thyroid mass or thyromegaly.     Vascular: No carotid bruit.     Trachea: Trachea normal.  Cardiovascular:     Rate and Rhythm: Normal rate and regular rhythm.     Pulses: Normal pulses.     Heart sounds: Normal heart sounds, S1 normal and S2 normal. No murmur heard.  No friction rub. No gallop.   Pulmonary:     Effort: Pulmonary effort is normal. No tachypnea or respiratory distress.     Breath sounds: Normal breath sounds. No decreased breath sounds, wheezing, rhonchi or rales.  Abdominal:     General: Bowel sounds are normal.     Palpations: Abdomen is soft.     Tenderness: There is abdominal tenderness in the suprapubic area.  Musculoskeletal:     Cervical back: Normal range of motion and neck supple.  Skin:    General: Skin is warm and dry.     Findings: No rash.  Neurological:     Mental Status: She is alert.  Psychiatric:        Mood and Affect: Mood is not anxious or depressed.        Speech: Speech normal.        Behavior: Behavior normal. Behavior is cooperative.        Thought Content: Thought content normal.        Judgment: Judgment normal.      Assessment and Plan   Hematuria Given history of thrombocytopenia and new onset hematuria without sign of UTI. Eval with Abd CT for renal sione. Increase water intake.  Eval renal function and cbc.  If CT negative .. plan referral to urology given hematuria is intermittent and persistant.  Allergic rhinitis Trial of flonase for  Allergies.  Thrombocytopenia ([place]) Re-eval.     Eliezer Lofts, MD

## 2020-05-17 ENCOUNTER — Ambulatory Visit: Payer: 59 | Admitting: Family Medicine

## 2020-05-17 ENCOUNTER — Ambulatory Visit (INDEPENDENT_AMBULATORY_CARE_PROVIDER_SITE_OTHER)
Admission: RE | Admit: 2020-05-17 | Discharge: 2020-05-17 | Disposition: A | Discharge status: Home / Self Care | Payer: 59 | Source: Ambulatory Visit | Attending: Family Medicine | Admitting: Family Medicine

## 2020-05-17 DIAGNOSIS — R319 Hematuria, unspecified: Secondary | ICD-10-CM

## 2020-05-17 DIAGNOSIS — R102 Pelvic and perineal pain: Secondary | ICD-10-CM | POA: Diagnosis not present

## 2020-05-17 LAB — RENAL FUNCTION PANEL
Albumin: 4.5 g/dL (ref 3.5–5.2)
BUN: 18 mg/dL (ref 6–23)
CO2: 27 mEq/L (ref 19–32)
Calcium: 9.6 mg/dL (ref 8.4–10.5)
Chloride: 105 mEq/L (ref 96–112)
Creatinine, Ser: 0.95 mg/dL (ref 0.40–1.20)
GFR: 74.77 mL/min (ref >60.00)
Glucose, Bld: 102 mg/dL — ABNORMAL HIGH (ref 70–99)
Phosphorus: 4.1 mg/dL (ref 2.3–4.6)
Potassium: 3.8 mEq/L (ref 3.5–5.1)
Sodium: 141 mEq/L (ref 135–145)

## 2020-05-17 LAB — CBC WITH DIFFERENTIAL/PLATELET
Basophils Absolute: 0.1 10*3/uL (ref 0.0–0.1)
Basophils Relative: 1.4 % (ref 0.0–3.0)
Eosinophils Absolute: 0.6 10*3/uL (ref 0.0–0.7)
Eosinophils Relative: 9 % — ABNORMAL HIGH (ref 0.0–5.0)
HCT: 37 % (ref 36.0–46.0)
Hemoglobin: 11.9 g/dL — ABNORMAL LOW (ref 12.0–15.0)
Lymphocytes Relative: 26.8 % (ref 12.0–46.0)
Lymphs Abs: 1.9 10*3/uL (ref 0.7–4.0)
MCHC: 32.3 g/dL (ref 30.0–36.0)
MCV: 84 fl (ref 78.0–100.0)
Monocytes Absolute: 0.5 10*3/uL (ref 0.1–1.0)
Monocytes Relative: 7.1 % (ref 3.0–12.0)
Neutro Abs: 4 10*3/uL (ref 1.4–7.7)
Neutrophils Relative %: 55.7 % (ref 43.0–77.0)
Platelets: 445 10*3/uL — ABNORMAL HIGH (ref 150.0–400.0)
RBC: 4.4 Mil/uL (ref 3.87–5.11)
RDW: 13.8 % (ref 11.5–15.5)
WBC: 7.1 10*3/uL (ref 4.0–10.5)

## 2020-06-20 DIAGNOSIS — R351 Nocturia: Secondary | ICD-10-CM | POA: Diagnosis not present

## 2020-06-20 DIAGNOSIS — R310 Gross hematuria: Secondary | ICD-10-CM | POA: Diagnosis not present

## 2020-07-12 ENCOUNTER — Other Ambulatory Visit: Payer: Self-pay | Admitting: Family Medicine

## 2020-07-12 DIAGNOSIS — I700 Atherosclerosis of aorta: Secondary | ICD-10-CM | POA: Diagnosis not present

## 2020-07-12 DIAGNOSIS — Z90710 Acquired absence of both cervix and uterus: Secondary | ICD-10-CM | POA: Diagnosis not present

## 2020-07-12 DIAGNOSIS — J309 Allergic rhinitis, unspecified: Secondary | ICD-10-CM | POA: Insufficient documentation

## 2020-07-12 DIAGNOSIS — K5730 Diverticulosis of large intestine without perforation or abscess without bleeding: Secondary | ICD-10-CM | POA: Diagnosis not present

## 2020-07-12 DIAGNOSIS — R229 Localized swelling, mass and lump, unspecified: Secondary | ICD-10-CM

## 2020-07-12 DIAGNOSIS — R310 Gross hematuria: Secondary | ICD-10-CM | POA: Diagnosis not present

## 2020-07-12 LAB — POCT UA - MICROSCOPIC ONLY

## 2020-07-12 NOTE — Assessment & Plan Note (Signed)
Trial of flonase for  Allergies.

## 2020-07-12 NOTE — Assessment & Plan Note (Signed)
Re-eval. 

## 2020-07-12 NOTE — Assessment & Plan Note (Addendum)
Given history of thrombocytopenia and new onset hematuria without sign of UTI. Eval with Abd CT for renal sione. Increase water intake.  Eval renal function and cbc.  If CT negative .. plan referral to urology given hematuria is intermittent and persistant.

## 2020-09-11 DIAGNOSIS — N3946 Mixed incontinence: Secondary | ICD-10-CM | POA: Diagnosis not present

## 2020-09-11 DIAGNOSIS — R310 Gross hematuria: Secondary | ICD-10-CM | POA: Diagnosis not present

## 2020-09-13 ENCOUNTER — Telehealth: Payer: Self-pay | Admitting: *Deleted

## 2020-09-13 NOTE — Telephone Encounter (Signed)
Can we get her in the office with me for Tuesday next week to discuss? I can add her on at 12:20.

## 2020-09-13 NOTE — Telephone Encounter (Signed)
Pt left message at triage. Pt said she has seen urologist for the hematuria and they didn't find a cause and released her but they did give her an abx incase she had a UTI. But they also said she needs to have her PCP refer her to a GYN to make sure that the vaginal bleeding isn't gyn related since it's not urology related.. Pt said she needs 3 things from PCP.   1st) Pt needs a referral to GYN she would like to see someone in [place] if possible.   2nd) pt said she needs a Rx for diflucan due to the abx the urologist put her on [place], pt said always gets a yeast inf after every abx use and forgot to ask them for diflucan and now she has been release so she is hoping PCP will send Rx in  3rd) pt said she needs repeat labs to get her thyroid rechecked and wanted to schedule an appt

## 2020-09-16 NOTE — Telephone Encounter (Signed)
Called patient-voicemail not set up

## 2020-09-20 NOTE — Telephone Encounter (Signed)
Hey I put hold on Tuesday 1220 for this patient she has been called and aware can you put in her for virtual visit please. :-)

## 2020-09-23 NOTE — Telephone Encounter (Signed)
Can you please place this on the schedule as I cant add?

## 2020-09-25 ENCOUNTER — Encounter: Payer: Self-pay | Admitting: Primary Care

## 2020-09-25 ENCOUNTER — Other Ambulatory Visit: Payer: Self-pay

## 2020-09-25 ENCOUNTER — Telehealth (INDEPENDENT_AMBULATORY_CARE_PROVIDER_SITE_OTHER): Payer: 59 | Admitting: Primary Care

## 2020-09-25 VITALS — Ht 64.0 in | Wt 200.0 lb

## 2020-09-25 DIAGNOSIS — R319 Hematuria, unspecified: Secondary | ICD-10-CM | POA: Diagnosis not present

## 2020-09-25 DIAGNOSIS — Z114 Encounter for screening for human immunodeficiency virus [HIV]: Secondary | ICD-10-CM | POA: Diagnosis not present

## 2020-09-25 DIAGNOSIS — D509 Iron deficiency anemia, unspecified: Secondary | ICD-10-CM | POA: Diagnosis not present

## 2020-09-25 DIAGNOSIS — R350 Frequency of micturition: Secondary | ICD-10-CM

## 2020-09-25 DIAGNOSIS — Z1159 Encounter for screening for other viral diseases: Secondary | ICD-10-CM | POA: Diagnosis not present

## 2020-09-25 DIAGNOSIS — E039 Hypothyroidism, unspecified: Secondary | ICD-10-CM

## 2020-09-25 DIAGNOSIS — Z1152 Encounter for screening for COVID-19: Secondary | ICD-10-CM | POA: Diagnosis not present

## 2020-09-25 DIAGNOSIS — E559 Vitamin D deficiency, unspecified: Secondary | ICD-10-CM | POA: Diagnosis not present

## 2020-09-25 NOTE — Assessment & Plan Note (Signed)
Levothyroxine is being administered along with vitamins and oral iron.  Discussed proper instructions for taking which include separating both pills 4 hours later.  Repeat TSH pending, but I suspect they will be abnormal given her incorrect administration.  If abnormal then will repeat again in 6 weeks.
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
## 2020-09-25 NOTE — Patient Instructions (Signed)
We will be in touch once you receive the lab results.  You can try taking melatonin at bedtime as needed for sleep.  Do not exceed 10 mg in 24 hours.  Be sure to take your levothyroxine (thyroid medication) every morning on an empty stomach with water only. No food or other medications for 30 minutes. No heartburn medication, iron pills, calcium, vitamin D, or magnesium pills within four hours of taking levothyroxine.   Nice to see you! Allie Bossier, NP-C

## 2020-09-25 NOTE — Assessment & Plan Note (Signed)
Chronic, currently following with urology who recommends GYN evaluation given negative work-up.  Referral placed to GYN.

## 2020-09-25 NOTE — Assessment & Plan Note (Signed)
Notified of deficiency at blood Drive 1.5  months ago.  Continue oral iron, iron panel and CBC pending.

## 2020-09-30 DIAGNOSIS — Z111 Encounter for screening for respiratory tuberculosis: Secondary | ICD-10-CM

## 2020-10-01 ENCOUNTER — Other Ambulatory Visit: Payer: Self-pay

## 2020-10-01 ENCOUNTER — Other Ambulatory Visit (INDEPENDENT_AMBULATORY_CARE_PROVIDER_SITE_OTHER): Payer: 59

## 2020-10-01 DIAGNOSIS — Z111 Encounter for screening for respiratory tuberculosis: Secondary | ICD-10-CM

## 2020-10-01 DIAGNOSIS — Z114 Encounter for screening for human immunodeficiency virus [HIV]: Secondary | ICD-10-CM

## 2020-10-01 DIAGNOSIS — D509 Iron deficiency anemia, unspecified: Secondary | ICD-10-CM

## 2020-10-01 DIAGNOSIS — Z1159 Encounter for screening for other viral diseases: Secondary | ICD-10-CM | POA: Diagnosis not present

## 2020-10-01 DIAGNOSIS — E559 Vitamin D deficiency, unspecified: Secondary | ICD-10-CM | POA: Diagnosis not present

## 2020-10-01 DIAGNOSIS — R350 Frequency of micturition: Secondary | ICD-10-CM | POA: Diagnosis not present

## 2020-10-01 DIAGNOSIS — E039 Hypothyroidism, unspecified: Secondary | ICD-10-CM | POA: Diagnosis not present

## 2020-10-01 DIAGNOSIS — Z1152 Encounter for screening for COVID-19: Secondary | ICD-10-CM

## 2020-10-01 LAB — POC URINALSYSI DIPSTICK (AUTOMATED)
Bilirubin, UA: NEGATIVE
Blood, UA: 1
Glucose, UA: NEGATIVE
Ketones, UA: NEGATIVE
Leukocytes, UA: NEGATIVE
Nitrite, UA: NEGATIVE
Protein, UA: POSITIVE — AB
Spec Grav, UA: >=1.03 — AB (ref 1.010–1.025)
Urobilinogen, UA: 0.2 E.U./dL
pH, UA: 5.5 (ref 5.0–8.0)

## 2020-10-01 LAB — SARS-COV-2 IGG: SARS-COV-2 IgG: 0.56

## 2020-10-01 LAB — CBC
HCT: 38.7 % (ref 36.0–46.0)
Hemoglobin: 12.7 g/dL (ref 12.0–15.0)
MCHC: 32.9 g/dL (ref 30.0–36.0)
MCV: 84 fl (ref 78.0–100.0)
Platelets: 427 10*3/uL — ABNORMAL HIGH (ref 150.0–400.0)
RBC: 4.61 Mil/uL (ref 3.87–5.11)
RDW: 14.5 % (ref 11.5–15.5)
WBC: 5.3 10*3/uL (ref 4.0–10.5)

## 2020-10-01 LAB — TSH: TSH: 5.63 u[IU]/mL — ABNORMAL HIGH (ref 0.35–4.50)

## 2020-10-01 LAB — IBC + FERRITIN
Ferritin: 89.3 ng/mL (ref 10.0–291.0)
Iron: 60 ug/dL (ref 42–145)
Saturation Ratios: 15.7 % — ABNORMAL LOW (ref 20.0–50.0)
Transferrin: 273 mg/dL (ref 212.0–360.0)

## 2020-10-01 LAB — VITAMIN D 25 HYDROXY (VIT D DEFICIENCY, FRACTURES): VITD: 11.92 ng/mL — ABNORMAL LOW (ref 30.00–100.00)

## 2020-10-02 ENCOUNTER — Other Ambulatory Visit: Payer: Self-pay | Admitting: Primary Care

## 2020-10-02 DIAGNOSIS — E559 Vitamin D deficiency, unspecified: Secondary | ICD-10-CM

## 2020-10-02 DIAGNOSIS — E039 Hypothyroidism, unspecified: Secondary | ICD-10-CM

## 2020-10-02 LAB — URINE CULTURE
MICRO NUMBER:: 11061551
SPECIMEN QUALITY:: ADEQUATE

## 2020-10-02 LAB — HEPATITIS C ANTIBODY
Hepatitis C Ab: NONREACTIVE
SIGNAL TO CUT-OFF: 0.01 (ref <1.00)

## 2020-10-02 LAB — HIV ANTIBODY (ROUTINE TESTING W REFLEX): HIV 1&2 Ab, 4th Generation: NONREACTIVE

## 2020-10-02 MED ORDER — LEVOTHYROXINE SODIUM 175 MCG PO TABS: ORAL_TABLET | ORAL | 0 refills | Status: DC

## 2020-10-02 MED ORDER — VITAMIN D (ERGOCALCIFEROL) 1.25 MG (50000 UNIT) PO CAPS: ORAL_CAPSULE | ORAL | 0 refills | Status: DC

## 2020-10-03 LAB — QUANTIFERON-TB GOLD PLUS
Mitogen-NIL: >10 IU/mL
NIL: 0.03 IU/mL
QuantiFERON-TB Gold Plus: NEGATIVE
TB1-NIL: 0 IU/mL
TB2-NIL: 0 IU/mL

## 2020-10-30 ENCOUNTER — Telehealth: Payer: Self-pay | Admitting: Radiology

## 2020-10-30 NOTE — Telephone Encounter (Signed)
left message for patient to [facility] to schedule New Gyn appointment from referral

## 2020-11-28 ENCOUNTER — Encounter: Payer: 59 | Admitting: Obstetrics and Gynecology

## 2020-12-11 ENCOUNTER — Emergency Department (HOSPITAL_COMMUNITY)
Admission: EM | Admit: 2020-12-11 | Discharge: 2020-12-11 | Disposition: A | Discharge status: Home / Self Care | Payer: Self-pay | Attending: Emergency Medicine | Admitting: Emergency Medicine

## 2020-12-11 ENCOUNTER — Other Ambulatory Visit: Payer: Self-pay

## 2020-12-11 DIAGNOSIS — Z5321 Procedure and treatment not carried out due to patient leaving prior to being seen by health care provider: Secondary | ICD-10-CM | POA: Insufficient documentation

## 2020-12-11 DIAGNOSIS — W228XXA Striking against or struck by other objects, initial encounter: Secondary | ICD-10-CM | POA: Insufficient documentation

## 2020-12-11 DIAGNOSIS — S0990XA Unspecified injury of head, initial encounter: Secondary | ICD-10-CM | POA: Insufficient documentation

## 2020-12-11 NOTE — ED Notes (Signed)
Called for vitals no answer °

## 2020-12-11 NOTE — ED Notes (Signed)
Called pt x5 for vitals, no response. °
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
## 2021-02-12 ENCOUNTER — Encounter: Payer: Self-pay | Admitting: Primary Care

## 2021-05-05 ENCOUNTER — Other Ambulatory Visit: Payer: Self-pay

## 2021-05-05 ENCOUNTER — Emergency Department: Payer: Self-pay

## 2021-05-05 ENCOUNTER — Emergency Department
Admission: EM | Admit: 2021-05-05 | Discharge: 2021-05-05 | Disposition: A | Discharge status: Home / Self Care | Payer: Self-pay | Attending: Emergency Medicine | Admitting: Emergency Medicine

## 2021-05-05 DIAGNOSIS — R202 Paresthesia of skin: Secondary | ICD-10-CM | POA: Insufficient documentation

## 2021-05-05 DIAGNOSIS — E039 Hypothyroidism, unspecified: Secondary | ICD-10-CM | POA: Insufficient documentation

## 2021-05-05 DIAGNOSIS — I10 Essential (primary) hypertension: Secondary | ICD-10-CM | POA: Insufficient documentation

## 2021-05-05 DIAGNOSIS — R5383 Other fatigue: Secondary | ICD-10-CM | POA: Insufficient documentation

## 2021-05-05 DIAGNOSIS — Z20822 Contact with and (suspected) exposure to covid-19: Secondary | ICD-10-CM | POA: Insufficient documentation

## 2021-05-05 DIAGNOSIS — Z87891 Personal history of nicotine dependence: Secondary | ICD-10-CM | POA: Insufficient documentation

## 2021-05-05 DIAGNOSIS — J4530 Mild persistent asthma, uncomplicated: Secondary | ICD-10-CM | POA: Insufficient documentation

## 2021-05-05 DIAGNOSIS — Z7951 Long term (current) use of inhaled steroids: Secondary | ICD-10-CM | POA: Insufficient documentation

## 2021-05-05 DIAGNOSIS — Z79899 Other long term (current) drug therapy: Secondary | ICD-10-CM | POA: Insufficient documentation

## 2021-05-05 DIAGNOSIS — R0789 Other chest pain: Secondary | ICD-10-CM | POA: Insufficient documentation

## 2021-05-05 DIAGNOSIS — R531 Weakness: Secondary | ICD-10-CM | POA: Insufficient documentation

## 2021-05-05 LAB — TROPONIN I (HIGH SENSITIVITY)
Troponin I (High Sensitivity): <2 ng/L (ref <18)
Troponin I (High Sensitivity): <2 ng/L (ref <18)

## 2021-05-05 LAB — CBC
HCT: 34.6 % — ABNORMAL LOW (ref 36.0–46.0)
Hemoglobin: 11.7 g/dL — ABNORMAL LOW (ref 12.0–15.0)
MCH: 27.8 pg (ref 26.0–34.0)
MCHC: 33.8 g/dL (ref 30.0–36.0)
MCV: 82.2 fL (ref 80.0–100.0)
Platelets: 400 10*3/uL (ref 150–400)
RBC: 4.21 MIL/uL (ref 3.87–5.11)
RDW: 13.7 % (ref 11.5–15.5)
WBC: 4.7 10*3/uL (ref 4.0–10.5)
nRBC: 0 % (ref 0.0–0.2)

## 2021-05-05 LAB — BASIC METABOLIC PANEL
Anion gap: 8 (ref 5–15)
BUN: 12 mg/dL (ref 6–20)
CO2: 23 mmol/L (ref 22–32)
Calcium: 9.3 mg/dL (ref 8.9–10.3)
Chloride: 109 mmol/L (ref 98–111)
Creatinine, Ser: 0.69 mg/dL (ref 0.44–1.00)
GFR, Estimated: >60 mL/min (ref >60)
Glucose, Bld: 97 mg/dL (ref 70–99)
Potassium: 3.5 mmol/L (ref 3.5–5.1)
Sodium: 140 mmol/L (ref 135–145)

## 2021-05-05 LAB — RESP PANEL BY RT-PCR (FLU A&B, COVID) ARPGX2
Influenza A by PCR: NEGATIVE
Influenza B by PCR: NEGATIVE
SARS Coronavirus 2 by RT PCR: NEGATIVE

## 2021-05-05 LAB — T4, FREE: Free T4: 0.9 ng/dL (ref 0.61–1.12)

## 2021-05-05 LAB — TSH: TSH: 6.702 u[IU]/mL — ABNORMAL HIGH (ref 0.350–4.500)

## 2021-05-05 MED ORDER — GABAPENTIN 300 MG PO CAPS
300.0000 mg | ORAL_CAPSULE | Freq: Three times a day (TID) | ORAL | 0 refills | Status: DC | PRN
  Filled 2021-05-05: qty 20, 7d supply, fill #0

## 2021-05-05 MED ORDER — DIPHENHYDRAMINE HCL 50 MG/ML IJ SOLN
50.0000 mg | Freq: Once | INTRAMUSCULAR | Status: AC
  Administered 2021-05-05: 50 mg via INTRAVENOUS
  Filled 2021-05-05: qty 1

## 2021-05-05 MED ORDER — GABAPENTIN 300 MG PO CAPS: 300.0000 mg | ORAL_CAPSULE | Freq: Three times a day (TID) | ORAL | 0 refills | Status: DC | PRN

## 2021-05-05 MED ORDER — METHYLPREDNISOLONE SODIUM SUCC 125 MG IJ SOLR
125.0000 mg | Freq: Once | INTRAMUSCULAR | Status: AC
  Administered 2021-05-05: 125 mg via INTRAVENOUS
  Filled 2021-05-05: qty 2

## 2021-05-05 MED ORDER — ACETAMINOPHEN 500 MG PO TABS
1000.0000 mg | ORAL_TABLET | Freq: Once | ORAL | Status: AC
  Administered 2021-05-05: 1000 mg via ORAL

## 2021-05-05 MED ORDER — ACETAMINOPHEN 500 MG PO TABS
ORAL_TABLET | ORAL | Status: AC
  Filled 2021-05-05: qty 2

## 2021-05-05 MED ORDER — IOHEXOL 350 MG/ML SOLN
100.0000 mL | Freq: Once | INTRAVENOUS | Status: AC | PRN
  Administered 2021-05-05: 100 mL via INTRAVENOUS

## 2021-05-05 MED ORDER — GABAPENTIN 300 MG PO CAPS
300.0000 mg | ORAL_CAPSULE | Freq: Once | ORAL | Status: AC
  Administered 2021-05-05: 300 mg via ORAL
  Filled 2021-05-05: qty 1

## 2021-05-05 NOTE — ED Notes (Addendum)
Pt requested tylenol. Asked EDP for order.  Pt states has had to take benadryl and prednisone to premedicate before IV contrast because had hx hives with CT contrast. Informed EDP.

## 2021-05-05 NOTE — Discharge Instructions (Addendum)
Please seek medical attention for any high fevers, chest pain, shortness of breath, change in behavior, persistent vomiting, bloody stool or any other new or concerning symptoms.  

## 2021-05-05 NOTE — ED Triage Notes (Signed)
C/O 2 day history of bilateral arms and hands feeling numb intermittently.  Also c/o left chest and back pain since 0700.

## 2021-05-05 NOTE — ED Notes (Signed)
Pt not roomed yet. Will see pt when they arrive in room 9.
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
## 2021-05-05 NOTE — ED Notes (Signed)
EDP at bedside examining pt. °

## 2021-05-05 NOTE — ED Notes (Signed)
Pt c/o L jaw pain. Placed on cardiac monitor and repeat EKG performed. NSR on monitor. EKG handed to EDP.

## 2021-05-05 NOTE — ED Triage Notes (Signed)
Pt c/o BL arm pain that started yesterday and today woke with pain in her upper back that is making is difficult to breath, pt also c/o chest pressure/tightness. Pt is ambulatory to triage with a steady gait

## 2021-05-05 NOTE — ED Notes (Signed)
Spoke with CT and EDP re: timing of Benadryl. Pt will still need second dose right before CTA, at about 1500 and pt will have CTA at 1530.

## 2021-05-06 ENCOUNTER — Other Ambulatory Visit: Payer: Self-pay

## 2021-08-04 DIAGNOSIS — E039 Hypothyroidism, unspecified: Secondary | ICD-10-CM

## 2021-08-05 MED ORDER — LEVOTHYROXINE SODIUM 175 MCG PO TABS
ORAL_TABLET | ORAL | 0 refills | Status: DC
Start: 2021-08-05 — End: 2022-01-14

## 2021-08-05 NOTE — Telephone Encounter (Signed)
FYI

## 2021-08-05 NOTE — Telephone Encounter (Signed)
Please advise  Last seen virtual by kate on 09/25/2020 last office visit for back pain 01/16/2020. Did have labs done at ED on 05/05/2021?

## 2021-08-05 NOTE — Addendum Note (Signed)
Addended by: Lesleigh Noe on: 08/05/2021 04:42 PM   Modules accepted: Orders

## 2021-08-06 ENCOUNTER — Telehealth: Payer: Self-pay | Admitting: *Deleted

## 2021-08-06 ENCOUNTER — Ambulatory Visit: Payer: Self-pay | Admitting: Nurse Practitioner

## 2021-08-06 NOTE — Telephone Encounter (Signed)
PLEASE NOTE: All timestamps contained within this report are represented as [place] Standard Time. CONFIDENTIALTY NOTICE: This fax transmission is intended only for the addressee. It contains information that is legally privileged, confidential or otherwise protected from use or disclosure. If you are not the intended recipient, you are strictly prohibited from reviewing, disclosing, copying using or disseminating any of this information or taking any action in reliance on or regarding this information. If you have received this fax in error, please notify us immediately by telephone so that we can arrange for its return to [place]. Phone: [phone], Toll-Free: [phone], Fax: [phone] Page: 1 of 1 Call Id: [id number] [place] Night - Client Nonclinical Telephone Record  [facility] Client [place] [facility] - Client Client Site [place] Physician Romilda Garret- NP Contact Type Call Who Is Calling Patient / Member / Family / Caregiver Caller Name Lisa Mullen Caller Phone Number [phone] Patient Name Lisa Mullen Patient DOB [birth year] Call Type Message Only Information Provided Reason for Call Request to [place] Appointment Initial Comment Caller states that she is needing to cancel her appointment for tomorrow at 8 am,it was a medication appointment but the medication was called in. Patient request to speak to RN No Additional Comment Caller would like to make an appointment with Dr. Anda Kraft. Office hours provided, caller declined triage. Disp. Time Disposition Final User 08/05/2021 5:30:31 PM General Information Provided Yes Perla-Benitez, Jocelyn Call Closed By: Claris Gladden Transaction Date/Time: 08/05/2021 5:27:12 PM (ET)

## 2022-01-07 ENCOUNTER — Ambulatory Visit: Payer: Self-pay | Admitting: Primary Care

## 2022-01-14 ENCOUNTER — Other Ambulatory Visit: Payer: Self-pay | Admitting: Primary Care

## 2022-01-14 ENCOUNTER — Ambulatory Visit (INDEPENDENT_AMBULATORY_CARE_PROVIDER_SITE_OTHER): Payer: Self-pay | Admitting: Primary Care

## 2022-01-14 ENCOUNTER — Other Ambulatory Visit: Payer: Self-pay

## 2022-01-14 ENCOUNTER — Encounter: Payer: Self-pay | Admitting: Primary Care

## 2022-01-14 VITALS — BP 156/92 | HR 84 | Temp 97.6°F | Resp 16 | Ht 64.0 in | Wt 199.8 lb

## 2022-01-14 DIAGNOSIS — M2550 Pain in unspecified joint: Secondary | ICD-10-CM

## 2022-01-14 DIAGNOSIS — R202 Paresthesia of skin: Secondary | ICD-10-CM

## 2022-01-14 DIAGNOSIS — E559 Vitamin D deficiency, unspecified: Secondary | ICD-10-CM

## 2022-01-14 DIAGNOSIS — E039 Hypothyroidism, unspecified: Secondary | ICD-10-CM

## 2022-01-14 DIAGNOSIS — D696 Thrombocytopenia, unspecified: Secondary | ICD-10-CM

## 2022-01-14 DIAGNOSIS — R911 Solitary pulmonary nodule: Secondary | ICD-10-CM | POA: Insufficient documentation

## 2022-01-14 DIAGNOSIS — G4733 Obstructive sleep apnea (adult) (pediatric): Secondary | ICD-10-CM

## 2022-01-14 DIAGNOSIS — G5603 Carpal tunnel syndrome, bilateral upper limbs: Secondary | ICD-10-CM | POA: Insufficient documentation

## 2022-01-14 DIAGNOSIS — I10 Essential (primary) hypertension: Secondary | ICD-10-CM

## 2022-01-14 DIAGNOSIS — J4530 Mild persistent asthma, uncomplicated: Secondary | ICD-10-CM

## 2022-01-14 DIAGNOSIS — M25511 Pain in right shoulder: Secondary | ICD-10-CM

## 2022-01-14 DIAGNOSIS — M25519 Pain in unspecified shoulder: Secondary | ICD-10-CM | POA: Insufficient documentation

## 2022-01-14 DIAGNOSIS — J309 Allergic rhinitis, unspecified: Secondary | ICD-10-CM

## 2022-01-14 LAB — VITAMIN D 25 HYDROXY (VIT D DEFICIENCY, FRACTURES): VITD: 16.28 ng/mL — ABNORMAL LOW (ref 30.00–100.00)

## 2022-01-14 LAB — COMPREHENSIVE METABOLIC PANEL
ALT: 19 U/L (ref 0–35)
AST: 16 U/L (ref 0–37)
Albumin: 4.6 g/dL (ref 3.5–5.2)
Alkaline Phosphatase: 70 U/L (ref 39–117)
BUN: 11 mg/dL (ref 6–23)
CO2: 29 mEq/L (ref 19–32)
Calcium: 9.9 mg/dL (ref 8.4–10.5)
Chloride: 107 mEq/L (ref 96–112)
Creatinine, Ser: 0.86 mg/dL (ref 0.40–1.20)
GFR: 77.04 mL/min (ref >60.00)
Glucose, Bld: 91 mg/dL (ref 70–99)
Potassium: 4.2 mEq/L (ref 3.5–5.1)
Sodium: 142 mEq/L (ref 135–145)
Total Bilirubin: 0.5 mg/dL (ref 0.2–1.2)
Total Protein: 7.7 g/dL (ref 6.0–8.3)

## 2022-01-14 LAB — LIPID PANEL
Cholesterol: 216 mg/dL — ABNORMAL HIGH (ref 0–200)
HDL: 54 mg/dL (ref >39.00)
LDL Cholesterol: 142 mg/dL — ABNORMAL HIGH (ref 0–99)
NonHDL: 161.72
Total CHOL/HDL Ratio: 4
Triglycerides: 100 mg/dL (ref 0.0–149.0)
VLDL: 20 mg/dL (ref 0.0–40.0)

## 2022-01-14 LAB — CBC
HCT: 37.2 % (ref 36.0–46.0)
Hemoglobin: 12 g/dL (ref 12.0–15.0)
MCHC: 32.4 g/dL (ref 30.0–36.0)
MCV: 83.1 fl (ref 78.0–100.0)
Platelets: 449 10*3/uL — ABNORMAL HIGH (ref 150.0–400.0)
RBC: 4.47 Mil/uL (ref 3.87–5.11)
RDW: 14.6 % (ref 11.5–15.5)
WBC: 4.3 10*3/uL (ref 4.0–10.5)

## 2022-01-14 LAB — HEMOGLOBIN A1C: Hgb A1c MFr Bld: 5.8 % (ref 4.6–6.5)

## 2022-01-14 LAB — TSH: TSH: 14.2 u[IU]/mL — ABNORMAL HIGH (ref 0.35–5.50)

## 2022-01-14 MED ORDER — AMLODIPINE-OLMESARTAN 10-20 MG PO TABS: 1.0000 | ORAL_TABLET | Freq: Every day | ORAL | 0 refills | Status: DC

## 2022-01-14 MED ORDER — LEVOTHYROXINE SODIUM 150 MCG PO TABS: ORAL_TABLET | ORAL | 0 refills | Status: DC

## 2022-01-14 MED ORDER — ALBUTEROL SULFATE HFA 108 (90 BASE) MCG/ACT IN AERS: 2.0000 | INHALATION_SPRAY | RESPIRATORY_TRACT | 0 refills | Status: AC | PRN

## 2022-01-14 MED ORDER — GABAPENTIN 300 MG PO CAPS: 300.0000 mg | ORAL_CAPSULE | Freq: Three times a day (TID) | ORAL | 0 refills | Status: DC | PRN

## 2022-01-14 MED ORDER — FLUTICASONE PROPIONATE HFA 110 MCG/ACT IN AERO: 1.0000 | INHALATION_SPRAY | Freq: Two times a day (BID) | RESPIRATORY_TRACT | 3 refills | Status: DC

## 2022-01-14 MED ORDER — VITAMIN D3 1.25 MG (50000 UT) PO CAPS: 1.0000 | ORAL_CAPSULE | ORAL | 0 refills | Status: DC

## 2022-01-14 NOTE — Assessment & Plan Note (Signed)
Uncontrolled.  Resume Flovent 110 mcg twice daily, every day.  Refill sent to pharmacy.  Resume albuterol inhaler as needed.  Discussed that this is a rescue inhaler and to use as needed.  Refill sent to pharmacy.  No wheezing on exam, stable today in the office

## 2022-01-14 NOTE — Assessment & Plan Note (Signed)
Noncompliant to vitamin D.  Repeat vitamin D level pending.

## 2022-01-14 NOTE — Assessment & Plan Note (Signed)
Incidental finding that was noted on CT angio chest from May 2022.  Discussed results with patient. She would like to proceed with repeat CT chest in May 2023.

## 2022-01-14 NOTE — Assessment & Plan Note (Signed)
To bilateral hands and feet only.  We will need to evaluate this more thoroughly at a future visit, discussed this with patient today.  Resume gabapentin 3 to milligrams 3 times daily as needed, refill sent to pharmacy.

## 2022-01-14 NOTE — Assessment & Plan Note (Signed)
Undertreated as she has been taking her mother's 100 mcg dose  Repeat TSH pending today.  Will send refills once we review her TSH result.

## 2022-01-14 NOTE — Patient Instructions (Addendum)
Stop by the lab prior to leaving today. I will notify you of your results once received.   Resume your fluticasone (Flovent) inhaler. Inhale 1 puff into the lungs twice daily. This inhaler is to be used everyday!  Use the albuterol inhaler. Inhale 2 puffs into the lungs every 4 to 6 hours as needed for wheezing, cough, and/or shortness of breath. This inhaler is to be used as needed!  You may take gabapentin three times daily as needed for pain.  Schedule a visit with Dr. Lorelei Pont for your shoulder.  Schedule a follow up/physical with me for 2-3 weeks.  It was a pleasure to see you today!

## 2022-01-14 NOTE — Assessment & Plan Note (Signed)
Compliant to CPAP nightly, commended for her for this!

## 2022-01-14 NOTE — Assessment & Plan Note (Signed)
Uncontrolled today. Has been without medication for over 1 year.  Refills provided for amlodipine-olmesartan 10-20 mg for her to take once daily.  CMP pending. We will plan to see her back in 2 to 3 weeks for blood pressure check.
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
## 2022-01-14 NOTE — Assessment & Plan Note (Signed)
Suspect bursitis given decrease in range of motion on exam today.  She will set up a visit with our sports medicine physician for evaluation.  We also discussed the use of NSAIDs as needed.

## 2022-01-14 NOTE — Assessment & Plan Note (Signed)
Improved with gabapentin 300 mg for which was prescribed by the ED in May 2022.  Will resume gabapentin 300 mg 3 times daily as needed. Refill sent to pharmacy.

## 2022-01-14 NOTE — Assessment & Plan Note (Signed)
Repeat CBC pending. 

## 2022-02-03 ENCOUNTER — Encounter: Payer: Self-pay | Admitting: Primary Care

## 2022-02-03 ENCOUNTER — Ambulatory Visit (INDEPENDENT_AMBULATORY_CARE_PROVIDER_SITE_OTHER): Payer: No Typology Code available for payment source | Admitting: Primary Care

## 2022-02-03 ENCOUNTER — Other Ambulatory Visit: Payer: Self-pay

## 2022-02-03 VITALS — BP 130/78 | HR 84 | Temp 98.7°F | Ht 64.0 in | Wt 202.0 lb

## 2022-02-03 DIAGNOSIS — F419 Anxiety disorder, unspecified: Secondary | ICD-10-CM

## 2022-02-03 DIAGNOSIS — J4530 Mild persistent asthma, uncomplicated: Secondary | ICD-10-CM | POA: Diagnosis not present

## 2022-02-03 DIAGNOSIS — Z23 Encounter for immunization: Secondary | ICD-10-CM | POA: Diagnosis not present

## 2022-02-03 DIAGNOSIS — Z1231 Encounter for screening mammogram for malignant neoplasm of breast: Secondary | ICD-10-CM | POA: Diagnosis not present

## 2022-02-03 DIAGNOSIS — F32A Depression, unspecified: Secondary | ICD-10-CM

## 2022-02-03 DIAGNOSIS — Z0001 Encounter for general adult medical examination with abnormal findings: Secondary | ICD-10-CM | POA: Diagnosis not present

## 2022-02-03 DIAGNOSIS — I10 Essential (primary) hypertension: Secondary | ICD-10-CM

## 2022-02-03 DIAGNOSIS — E039 Hypothyroidism, unspecified: Secondary | ICD-10-CM

## 2022-02-03 DIAGNOSIS — R202 Paresthesia of skin: Secondary | ICD-10-CM

## 2022-02-03 DIAGNOSIS — E559 Vitamin D deficiency, unspecified: Secondary | ICD-10-CM

## 2022-02-03 MED ORDER — FLUTICASONE-SALMETEROL 100-50 MCG/ACT IN AEPB: 1.0000 | INHALATION_SPRAY | Freq: Two times a day (BID) | RESPIRATORY_TRACT | 3 refills | Status: DC

## 2022-02-03 MED ORDER — MONTELUKAST SODIUM 10 MG PO TABS: 10.0000 mg | ORAL_TABLET | Freq: Every day | ORAL | 3 refills | Status: DC

## 2022-02-03 MED ORDER — SERTRALINE HCL 50 MG PO TABS: 50.0000 mg | ORAL_TABLET | Freq: Every day | ORAL | 0 refills | Status: DC

## 2022-02-03 NOTE — Assessment & Plan Note (Signed)
Controlled.  Continue amlodipine-olmesartan 10-20 mg daily.   CMP pending.

## 2022-02-03 NOTE — Assessment & Plan Note (Signed)
Flovent not covered by insurance.  Rx for Advair 100-50 mcg sent to pharmacy, 1 puff BID. Rx for Singulair 10 mg sent to pharmacy.  We will see her back in 6 weeks. She will notify sooner if Advair is not covered.
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
## 2022-02-03 NOTE — Assessment & Plan Note (Signed)
First shingrix vaccine due and provided today. Other vaccines UTD. Mammogram overdue, ordered and pending. Colonoscopy UTD, due 2031.  Discussed the importance of a healthy diet and regular exercise in order for weight loss, and to reduce the risk of further co-morbidity.  Exam today as noted, Labs reviewed from January 2023.

## 2022-02-03 NOTE — Patient Instructions (Signed)
[facility].   Start sertraline (Zoloft) 50 mg for anxiety and depression. Take 1/2 tablet by mouth once daily for about one week, then increase to 1 full tablet thereafter.   Start Advair inhaler for asthma. Inhale 1 puff into the lungs twice daily.  Start montelukast (Singulair) 10 mg nightly at bedtime for asthma and allergies.   You will be contacted regarding your referral to therapy.  Please let us know if you have not been contacted within two weeks.   Please schedule a follow up visit for 6 weeks for follow up of anxiety/depression.  It was a pleasure to see you today!

## 2022-02-03 NOTE — Assessment & Plan Note (Signed)
Deteriorated. Uncontrolled.  Discussed options for treatment, she opts for both therapy and medication. Referral placed for therapy. Rx for Zoloft 50 mg sent to pharmacy.  Patient is to take 1/2 tablet daily for 8 days, then advance to 1 full tablet thereafter. We discussed possible side effects of headache, GI upset, drowsiness, and SI/HI. If thoughts of SI/HI develop, we discussed to present to the emergency immediately. Patient verbalized understanding.   Follow up in 6 weeks for re-evaluation.

## 2022-02-03 NOTE — Assessment & Plan Note (Signed)
Improved. Continue on gabapentin 300 mg in AM, 300 mg in afternoon, 600 mg HS.

## 2022-02-03 NOTE — Assessment & Plan Note (Signed)
Continue levothyroxine 150 mcg. Check TSH at next visit.

## 2022-02-03 NOTE — Assessment & Plan Note (Signed)
Compliant to vitamin D 50,000 units weekly,. Repeat vitamin D at next visit.

## 2022-03-17 ENCOUNTER — Encounter: Payer: Self-pay | Admitting: Primary Care

## 2022-03-17 ENCOUNTER — Other Ambulatory Visit: Payer: Self-pay

## 2022-03-17 ENCOUNTER — Ambulatory Visit (INDEPENDENT_AMBULATORY_CARE_PROVIDER_SITE_OTHER): Payer: No Typology Code available for payment source | Admitting: Primary Care

## 2022-03-17 VITALS — BP 134/82 | HR 94 | Temp 98.4°F | Ht 64.0 in | Wt 199.0 lb

## 2022-03-17 DIAGNOSIS — J4530 Mild persistent asthma, uncomplicated: Secondary | ICD-10-CM | POA: Diagnosis not present

## 2022-03-17 DIAGNOSIS — F32A Depression, unspecified: Secondary | ICD-10-CM

## 2022-03-17 DIAGNOSIS — F419 Anxiety disorder, unspecified: Secondary | ICD-10-CM

## 2022-03-17 DIAGNOSIS — R202 Paresthesia of skin: Secondary | ICD-10-CM

## 2022-03-17 DIAGNOSIS — E559 Vitamin D deficiency, unspecified: Secondary | ICD-10-CM | POA: Diagnosis not present

## 2022-03-17 DIAGNOSIS — E785 Hyperlipidemia, unspecified: Secondary | ICD-10-CM | POA: Diagnosis not present

## 2022-03-17 DIAGNOSIS — E039 Hypothyroidism, unspecified: Secondary | ICD-10-CM

## 2022-03-17 DIAGNOSIS — J309 Allergic rhinitis, unspecified: Secondary | ICD-10-CM

## 2022-03-17 DIAGNOSIS — J302 Other seasonal allergic rhinitis: Secondary | ICD-10-CM

## 2022-03-17 LAB — LIPID PANEL
Cholesterol: 206 mg/dL — ABNORMAL HIGH (ref 0–200)
HDL: 45.5 mg/dL (ref >39.00)
LDL Cholesterol: 129 mg/dL — ABNORMAL HIGH (ref 0–99)
NonHDL: 160.72
Total CHOL/HDL Ratio: 5
Triglycerides: 158 mg/dL — ABNORMAL HIGH (ref 0.0–149.0)
VLDL: 31.6 mg/dL (ref 0.0–40.0)

## 2022-03-17 LAB — TSH: TSH: 1.23 u[IU]/mL (ref 0.35–5.50)

## 2022-03-17 LAB — VITAMIN D 25 HYDROXY (VIT D DEFICIENCY, FRACTURES): VITD: 21.85 ng/mL — ABNORMAL LOW (ref 30.00–100.00)

## 2022-03-17 MED ORDER — FLUTICASONE PROPIONATE 50 MCG/ACT NA SUSP: 1.0000 | Freq: Two times a day (BID) | NASAL | 2 refills | Status: AC | PRN

## 2022-03-17 MED ORDER — SERTRALINE HCL 100 MG PO TABS: 100.0000 mg | ORAL_TABLET | Freq: Every day | ORAL | 0 refills | Status: DC

## 2022-03-17 MED ORDER — BUDESONIDE-FORMOTEROL FUMARATE 80-4.5 MCG/ACT IN AERO: 1.0000 | INHALATION_SPRAY | Freq: Two times a day (BID) | RESPIRATORY_TRACT | 5 refills | Status: DC

## 2022-03-17 MED ORDER — OLOPATADINE HCL 0.1 % OP SOLN: 1.0000 [drp] | Freq: Two times a day (BID) | OPHTHALMIC | 0 refills | Status: DC | PRN

## 2022-03-17 MED ORDER — GABAPENTIN 600 MG PO TABS: 600.0000 mg | ORAL_TABLET | Freq: Three times a day (TID) | ORAL | 1 refills | Status: DC

## 2022-03-17 NOTE — Assessment & Plan Note (Signed)
Inconsistent use of vitamin D 50,000 IUs weekly, she prefers something daily. ? ?Repeat vitamin D level pending. ?Will recommend OTC dose once labs have returned. ?

## 2022-03-17 NOTE — Assessment & Plan Note (Signed)
Advair cost prohibitive. ?Inappropriate use of albuterol inhaler, discussed this today. ? ?Prescription for Symbicort 80-4.5 mcg sent to pharmacy, 1 to 2 puffs twice daily.  I stressed the importance of notify me if this inhaler is also cost prohibitive. ? ?Continue Singulair 10 mg daily for now. ? ?Follow-up in 1 month. ?

## 2022-03-17 NOTE — Assessment & Plan Note (Signed)
Prescription for olopatadine drops sent to pharmacy to use as needed. ?

## 2022-03-17 NOTE — Assessment & Plan Note (Signed)
Consistent use of levothyroxine, she is taking correctly. ? ?Repeat TSH pending. ?Continue levothyroxine 150 mcg daily. ?
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
## 2022-03-17 NOTE — Assessment & Plan Note (Signed)
Suspicious for carpal tunnel syndrome. ?Referral placed to neurology. ? ?We changed her gabapentin prescription to 600 mg tablets 3 times daily, discussed to only take 1 tablet 3 times daily ?

## 2022-03-17 NOTE — Patient Instructions (Signed)
Stop by the lab prior to leaving today. I will notify you of your results once received.  ? ?You will be contacted regarding your referral to neurology.  Please let us know if you have not been contacted within two weeks.  ? ?We increased the dose of your sertraline (Zoloft) to 100 mg.  Take 1 tablet by mouth once daily.  I sent a new prescription to your pharmacy. ? ?I changed your gabapentin dose to 600 mg to reflect what you are taking. Take 1 tablet by mouth three times daily. ? ?It was a pleasure to see you today! ? ? ? ?

## 2022-03-17 NOTE — Assessment & Plan Note (Signed)
Improving, still not at goal. ? ?Increase Zoloft to 100 mg daily, new prescription sent to pharmacy. ? ?Follow-up in 1 month ?

## 2022-03-23 ENCOUNTER — Encounter: Payer: Self-pay | Admitting: Neurology

## 2022-04-01 ENCOUNTER — Other Ambulatory Visit: Payer: Self-pay

## 2022-04-01 DIAGNOSIS — R202 Paresthesia of skin: Secondary | ICD-10-CM

## 2022-04-02 ENCOUNTER — Ambulatory Visit (INDEPENDENT_AMBULATORY_CARE_PROVIDER_SITE_OTHER): Payer: No Typology Code available for payment source | Admitting: Neurology

## 2022-04-02 DIAGNOSIS — R202 Paresthesia of skin: Secondary | ICD-10-CM

## 2022-04-02 DIAGNOSIS — G5603 Carpal tunnel syndrome, bilateral upper limbs: Secondary | ICD-10-CM

## 2022-04-02 NOTE — Procedures (Signed)
[facility]  ?[address], [address] ? [place], [place] [address] ?Tel: [phone] ?Fax:  [phone] ?Test Date:  04/02/2022 ? ?Patient: Lisa Mullen DOB: [birth year] Physician: Narda Amber, DO  ?Sex: Female Height: '5\' 4"'$  Ref Phys: Alma Friendly, NP  ?ID#: [id number]   Technician:   ? ?Patient Complaints: ?This is a 56 year old female referred for evaluation of bilateral hand paresthesias. ? ?NCV & EMG Findings: ?Extensive electrodiagnostic testing of the right upper extremity and additional studies of the left shows:  ?Right median sensory response shows prolonged latency (7.1 ms).  Left median sensory response shows prolonged latency (6.6 ms) and reduced amplitude (10.8 ?V).  Bilateral ulnar sensory responses are within normal limits. ?Bilateral median motor responses show prolonged latency (R4.8, L4.6 ms).  Of note, there is evidence of a bilateral Martin-Gruber anastomoses, a normal anatomic variant.  Bilateral ulnar motor responses are within normal limits.  ?Chronic motor axonal loss changes are seen affecting bilateral abductor pollicis brevis muscles, without accompanied active denervation.  ? ?Impression: ?Bilateral median neuropathy at or distal to the wrist, consistent with a clinical diagnosis of carpal tunnel syndrome.  Overall, these findings are moderate in degree electrically and worse on the left. ? ? ?___________________________ ?Narda Amber, DO ? ? ? ?Nerve Conduction Studies ?Anti Sensory Summary Table ? ? Stim Site NR Peak (ms) Norm Peak (ms) P-T Amp (?V) Norm P-T Amp  ?Left Median Anti Sensory (2nd Digit)  34?C  ?Wrist    6.6 <3.6 10.8 >15  ?Right Median Anti Sensory (2nd Digit)  34?C  ?Wrist    7.1 <3.6 17.8 >15  ?Left Ulnar Anti Sensory (5th Digit)  34?C  ?Wrist    2.4 <3.1 34.7 >10  ?Right Ulnar Anti Sensory (5th Digit)  34?C  ?Wrist    2.4 <3.1 37.2 >10  ? ?Motor Summary Table ? ? Stim Site NR Onset (ms) Norm Onset (ms) O-P Amp (mV) Norm O-P Amp Site1 Site2 Delta-0 (ms)  Dist (cm) Vel (m/s) Norm Vel (m/s)  ?Left Median Motor (Abd Poll Brev)  34?C  ?Wrist    4.6 <4.0 8.8 >6 Elbow Wrist 5.0 30.0 60 >50  ?Elbow    9.6  9.2  Ulnar-wrist crossover Elbow 5.5 0.0    ?Ulnar-wrist crossover    4.1  6.0         ?Right Median Motor (Abd Poll Brev)  34?C  ?Wrist    4.8 <4.0 6.0 >6 Elbow Wrist 5.6 30.0 54 >50  ?Elbow    10.4  5.8  Ulnar-wrist crossover Elbow 6.3 0.0    ?Ulnar-wrist crossover    4.1  4.5         ?Left Ulnar Motor (Abd Dig Minimi)  34?C  ?Wrist    2.2 <3.1 13.7 >7 B Elbow Wrist 3.5 22.0 63 >50  ?B Elbow    5.7  12.9  A Elbow B Elbow 1.6 10.0 63 >50  ?A Elbow    7.3  12.4         ?Right Ulnar Motor (Abd Dig Minimi)  34?C  ?Wrist    2.2 <3.1 13.4 >7 B Elbow Wrist 3.7 24.0 65 >50  ?B Elbow    5.9  12.6  A Elbow B Elbow 1.6 10.0 63 >50  ?A Elbow    7.5  12.0         ? ?EMG ? ? Side Muscle Ins Act Fibs Psw Fasc Number Recrt Dur Dur. Amp Amp. Poly Poly. Comment  ?Right 1stDorInt  Nml Nml Nml Nml Nml Nml Nml Nml Nml Nml Nml Nml N/A  ?Right Abd Poll Brev Nml Nml Nml Nml 1- Rapid Some 1+ Some 1+ Some 1+ N/A  ?Right PronatorTeres Nml Nml Nml Nml Nml Nml Nml Nml Nml Nml Nml Nml N/A  ?Right Biceps Nml Nml Nml Nml Nml Nml Nml Nml Nml Nml Nml Nml N/A  ?Right Triceps Nml Nml Nml Nml Nml Nml Nml Nml Nml Nml Nml Nml N/A  ?Right Deltoid Nml Nml Nml Nml Nml Nml Nml Nml Nml Nml Nml Nml N/A  ?Left 1stDorInt Nml Nml Nml Nml Nml Nml Nml Nml Nml Nml Nml Nml N/A  ?Left Abd Poll Brev Nml Nml Nml Nml 1- Rapid Some 1+ Some 1+ Some 1+ N/A  ?Left PronatorTeres Nml Nml Nml Nml Nml Nml Nml Nml Nml Nml Nml Nml N/A  ?Left Biceps Nml Nml Nml Nml Nml Nml Nml Nml Nml Nml Nml Nml N/A  ?Left Triceps Nml Nml Nml Nml Nml Nml Nml Nml Nml Nml Nml Nml N/A  ?Left Deltoid Nml Nml Nml Nml Nml Nml Nml Nml Nml Nml Nml Nml N/A  ? ? ? ? ?Waveforms: ?    ? ?    ? ?   ? ? ?

## 2022-04-22 ENCOUNTER — Ambulatory Visit (INDEPENDENT_AMBULATORY_CARE_PROVIDER_SITE_OTHER): Payer: No Typology Code available for payment source | Admitting: Primary Care

## 2022-04-22 ENCOUNTER — Encounter: Payer: Self-pay | Admitting: Primary Care

## 2022-04-22 VITALS — BP 128/88 | HR 75 | Ht 64.0 in | Wt 198.0 lb

## 2022-04-22 DIAGNOSIS — G5603 Carpal tunnel syndrome, bilateral upper limbs: Secondary | ICD-10-CM

## 2022-04-22 DIAGNOSIS — J302 Other seasonal allergic rhinitis: Secondary | ICD-10-CM

## 2022-04-22 DIAGNOSIS — F32A Depression, unspecified: Secondary | ICD-10-CM

## 2022-04-22 DIAGNOSIS — E039 Hypothyroidism, unspecified: Secondary | ICD-10-CM | POA: Diagnosis not present

## 2022-04-22 DIAGNOSIS — F419 Anxiety disorder, unspecified: Secondary | ICD-10-CM

## 2022-04-22 DIAGNOSIS — R229 Localized swelling, mass and lump, unspecified: Secondary | ICD-10-CM

## 2022-04-22 DIAGNOSIS — R911 Solitary pulmonary nodule: Secondary | ICD-10-CM

## 2022-04-22 DIAGNOSIS — J4530 Mild persistent asthma, uncomplicated: Secondary | ICD-10-CM

## 2022-04-22 MED ORDER — OLOPATADINE HCL 0.1 % OP SOLN: 1.0000 [drp] | Freq: Two times a day (BID) | OPHTHALMIC | 0 refills | Status: DC | PRN

## 2022-04-22 NOTE — Patient Instructions (Signed)
Our pharmacist will be in contact with you regarding the inhaler. ? ?Continue Zoloft 100 mg daily for anxiety and depression. ? ?You will be contacted regarding your referral to orthopedics for the carpal tunnel appointment and general surgery for the skin mass.  Please let us know if you have not been contacted within two weeks.  ? ?You will be contacted regarding your CT chest.  Please let us know if you have not been contacted within two weeks.  ? ?It was a pleasure to see you today! ? ? ?

## 2022-04-22 NOTE — Assessment & Plan Note (Signed)
Due for repeat CT chest. ?Orders placed. ?

## 2022-04-22 NOTE — Assessment & Plan Note (Signed)
Exam today representative of cyst.  History of cyst that was removed years ago. ? ?Patient requesting removal, referral placed to general surgery. ?

## 2022-04-22 NOTE — Assessment & Plan Note (Signed)
Both Advair and Symbicort are cost prohibitive. ?Will have our pharmacist look into patient assistance as she is using albuterol too frequently. ? ?Continue Singulair 10 mg.  ?

## 2022-04-22 NOTE — Assessment & Plan Note (Signed)
Controlled! ? ?Continue levothyroxine 150 mcg daily. ? ?

## 2022-04-22 NOTE — Assessment & Plan Note (Signed)
Improved! ? ?Continue Zoloft 100 mg daily. ?Continue to closely monitor.  ?
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
## 2022-04-22 NOTE — Assessment & Plan Note (Addendum)
New diagnosis. ? ?Patient ready for treatment as gabapentin and other conservative treatments provide minimal relief. ? ?Referral placed to orthopedics for evaluation. ?

## 2022-04-24 ENCOUNTER — Telehealth: Payer: Self-pay | Admitting: Pharmacist

## 2022-04-24 DIAGNOSIS — J4530 Mild persistent asthma, uncomplicated: Secondary | ICD-10-CM

## 2022-04-24 MED ORDER — FLUTICASONE FUROATE-VILANTEROL 100-25 MCG/ACT IN AEPB: 1.0000 | INHALATION_SPRAY | Freq: Every day | RESPIRATORY_TRACT | 2 refills | Status: DC

## 2022-04-24 NOTE — Telephone Encounter (Signed)
Sent Rx to preferred pharmacy. 

## 2022-04-24 NOTE — Telephone Encounter (Signed)
Spoke with pharmacist and the generic cost is $385.00 . ? ?Charlene Brooke, CPP notified ? ?Anita Mcadory, CCMA ?Health concierge  ?[phone]  ?

## 2022-04-24 NOTE — Telephone Encounter (Signed)
-----   Message from Pleas Koch, NP sent at 04/23/2022  6:35 PM EDT ----- ?Regarding: RE: Patient Assistance ?YES! Please do send the generic Breo! ?Thanks! ?----- Message ----- ?From: Charlton Haws, [facility] ?Sent: 04/22/2022   2:15 PM EDT ?To: Pleas Koch, NP ?Subject: RE: Patient Assistance                        ? ?So patient assistance will only work for [employer] or uninsured patients, so she wouldn't qualify unfortunately. I tried to look up her formulary and it looks like they do prefer the generic versions of Breo (fluticasone furoate/vilanterol - Tier 1) or Advair (fluticasone-salmeterol - Tier 2). ? ?I called [facility] to ask them to run generic Advair through since there is already an Rx on file - turns out it IS covered, the copay is just high ($125 per month - this could be due to a high deductible though). ? ?Are you ok if I send an Rx for generic [place] so they can price it out? It might be better since it looks like it's Tier 1. ? ? ?----- Message ----- ?From: Pleas Koch, NP ?Sent: 04/22/2022   1:30 PM EDT ?To: Charlton Haws, RPH ?Subject: Patient Assistance                            ? ?Hey!  ? ?This patient's insurance will not cover Advair or Symbicort for her uncontrolled Asthma. She called her insurance company and they basically said that her coverage is limited. ? ?Can you see if she qualifies for patient assistance for either inhaler? ? ?Inappropriate use of albuterol. ? ?Thanks! ?Allie Bossier ? ? ? ?

## 2022-04-28 NOTE — Telephone Encounter (Signed)
Contacted [employer] and spoke with Darian. [employer] one has a plan for the patients that they will reimburse 80% for the generic Breo.  [facility] needs the discount card information  BIN: [id number]  PCN:  [id number]    Group:[id number]  ID#  [id number]. ? They will text this information also to the patient and she is to get receipt and send in for reimbursement.Process can take a few weeks to complete. Message left for the patient with information and to return my call for any questions. ? ?Charlene Brooke, CPP notified ? ?Norman Piacentini, CCMA ?Health concierge  ?[phone]  ?

## 2022-05-07 ENCOUNTER — Encounter: Payer: Self-pay | Admitting: *Deleted

## 2022-05-15 ENCOUNTER — Emergency Department: Payer: No Typology Code available for payment source

## 2022-05-15 ENCOUNTER — Emergency Department
Admission: EM | Admit: 2022-05-15 | Discharge: 2022-05-15 | Disposition: A | Discharge status: Home / Self Care | Payer: No Typology Code available for payment source | Attending: Emergency Medicine | Admitting: Emergency Medicine

## 2022-05-15 ENCOUNTER — Encounter: Payer: Self-pay | Admitting: Intensive Care

## 2022-05-15 ENCOUNTER — Other Ambulatory Visit: Payer: Self-pay

## 2022-05-15 DIAGNOSIS — M25511 Pain in right shoulder: Secondary | ICD-10-CM | POA: Diagnosis not present

## 2022-05-15 DIAGNOSIS — R0789 Other chest pain: Secondary | ICD-10-CM | POA: Diagnosis present

## 2022-05-15 DIAGNOSIS — R079 Chest pain, unspecified: Secondary | ICD-10-CM

## 2022-05-15 DIAGNOSIS — R0602 Shortness of breath: Secondary | ICD-10-CM | POA: Diagnosis not present

## 2022-05-15 DIAGNOSIS — I10 Essential (primary) hypertension: Secondary | ICD-10-CM | POA: Insufficient documentation

## 2022-05-15 DIAGNOSIS — M25512 Pain in left shoulder: Secondary | ICD-10-CM | POA: Diagnosis not present

## 2022-05-15 LAB — BASIC METABOLIC PANEL
Anion gap: 10 (ref 5–15)
BUN: 15 mg/dL (ref 6–20)
CO2: 24 mmol/L (ref 22–32)
Calcium: 9.3 mg/dL (ref 8.9–10.3)
Chloride: 106 mmol/L (ref 98–111)
Creatinine, Ser: 0.75 mg/dL (ref 0.44–1.00)
GFR, Estimated: >60 mL/min (ref >60)
Glucose, Bld: 111 mg/dL — ABNORMAL HIGH (ref 70–99)
Potassium: 3.7 mmol/L (ref 3.5–5.1)
Sodium: 140 mmol/L (ref 135–145)

## 2022-05-15 LAB — CBC
HCT: 36.9 % (ref 36.0–46.0)
Hemoglobin: 11.9 g/dL — ABNORMAL LOW (ref 12.0–15.0)
MCH: 26.7 pg (ref 26.0–34.0)
MCHC: 32.2 g/dL (ref 30.0–36.0)
MCV: 82.9 fL (ref 80.0–100.0)
Platelets: 444 10*3/uL — ABNORMAL HIGH (ref 150–400)
RBC: 4.45 MIL/uL (ref 3.87–5.11)
RDW: 13.9 % (ref 11.5–15.5)
WBC: 6.4 10*3/uL (ref 4.0–10.5)
nRBC: 0 % (ref 0.0–0.2)

## 2022-05-15 LAB — TROPONIN I (HIGH SENSITIVITY): Troponin I (High Sensitivity): 3 ng/L (ref <18)

## 2022-05-15 MED ORDER — MORPHINE SULFATE (PF) 4 MG/ML IV SOLN
4.0000 mg | Freq: Once | INTRAVENOUS | Status: AC
  Administered 2022-05-15: 4 mg via INTRAVENOUS
  Filled 2022-05-15: qty 1

## 2022-05-15 MED ORDER — DIPHENHYDRAMINE HCL 50 MG/ML IJ SOLN
50.0000 mg | Freq: Once | INTRAMUSCULAR | Status: AC
  Administered 2022-05-15: 50 mg via INTRAVENOUS
  Filled 2022-05-15: qty 1

## 2022-05-15 MED ORDER — DIPHENHYDRAMINE HCL 50 MG/ML IJ SOLN
INTRAMUSCULAR | Status: AC
  Filled 2022-05-15: qty 1

## 2022-05-15 MED ORDER — CYCLOBENZAPRINE HCL 5 MG PO TABS
5.0000 mg | ORAL_TABLET | Freq: Three times a day (TID) | ORAL | 0 refills | Status: AC | PRN
Start: 2022-05-15 — End: ?

## 2022-05-15 MED ORDER — DIPHENHYDRAMINE HCL 25 MG PO CAPS: 50.0000 mg | ORAL_CAPSULE | Freq: Once | ORAL | Status: AC

## 2022-05-15 MED ORDER — IOHEXOL 350 MG/ML SOLN
75.0000 mL | Freq: Once | INTRAVENOUS | Status: AC | PRN
  Administered 2022-05-15: 75 mL via INTRAVENOUS

## 2022-05-15 MED ORDER — METHYLPREDNISOLONE SODIUM SUCC 40 MG IJ SOLR
40.0000 mg | Freq: Once | INTRAMUSCULAR | Status: AC
  Administered 2022-05-15: 40 mg via INTRAVENOUS
  Filled 2022-05-15: qty 1

## 2022-05-15 MED ORDER — ONDANSETRON HCL 4 MG/2ML IJ SOLN
4.0000 mg | Freq: Once | INTRAMUSCULAR | Status: AC
Start: 2022-05-15 — End: 2022-05-15
  Administered 2022-05-15: 4 mg via INTRAVENOUS
  Filled 2022-05-15: qty 2

## 2022-05-15 MED ORDER — CYCLOBENZAPRINE HCL 5 MG PO TABS: 5.0000 mg | ORAL_TABLET | Freq: Three times a day (TID) | ORAL | 0 refills | Status: DC | PRN

## 2022-05-15 NOTE — ED Triage Notes (Signed)
Patient c/o central chest pain that radiates to bilateral shoulder blades and left arm with nausea. Started around 6:38am today.

## 2022-05-15 NOTE — ED Notes (Signed)
DC ppw provided to patient. RX info and followup information reviewed. PT questions answered. pt declines vs at time of DC. PT provides verbal consent for dc. Pt ambulatory to lobby alert and oriented x4. 

## 2022-05-15 NOTE — ED Provider Notes (Signed)
[facility] Provider Note    Event Date/Time   First MD Initiated Contact with Patient 05/15/22 1314     (approximate)  History   Chief Complaint: Chest Pain  HPI  Lisa Mullen is a 56 y.o. female with a past medical history of hypertension, shortness of breath, presents to the emergency department for chest pain.  According to the patient since this morning she has developed pain in the center of her chest radiating to bilateral shoulder blades along with nausea as well.  Patient states the majority of her pain is right in between both of her shoulder blades somewhat worse with deep inspiration or movement.  No leg pain or swelling.  Patient states she has had a pain similar in the past and was diagnosed with "chest syndrome" patient does state a history of sickle cell trait but not sickle cell disease.  No prior blood clots or known heart disease.  Physical Exam   Triage Vital Signs: ED Triage Vitals  Enc Vitals Group     BP 05/15/22 1126 (!) 164/106     Pulse Rate 05/15/22 1126 79     Resp 05/15/22 1126 18     Temp 05/15/22 1126 98.3 F (36.8 C)     Temp Source 05/15/22 1126 Oral     SpO2 05/15/22 1126 97 %     Weight 05/15/22 1123 197 lb (89.4 kg)     Height 05/15/22 1123 '5\' 3"'$  (1.6 m)     Head Circumference --      Peak Flow --      Pain Score 05/15/22 1123 9     Pain Loc --      Pain Edu? --      Excl. in [place]? --     Most recent vital signs: Vitals:   05/15/22 1126  BP: (!) 164/106  Pulse: 79  Resp: 18  Temp: 98.3 F (36.8 C)  SpO2: 97%    General: Awake, no distress.  CV:  Good peripheral perfusion.  Regular rate and rhythm  Resp:  Normal effort.  Equal breath sounds bilaterally.  Abd:  No distention.  Soft, nontender.  No rebound or guarding.    ED Results / Procedures / Treatments   EKG  EKG viewed and interpreted by myself shows a normal sinus rhythm at 76 bpm with a narrow QRS, normal axis, normal intervals, no  concerning ST changes.  RADIOLOGY  I personally interpreted the chest x-ray images no significant abnormality seen on my evaluation. Radiology is read the chest x-ray is negative   MEDICATIONS ORDERED IN ED: Medications  methylPREDNISolone sodium succinate (SOLU-MEDROL) 40 mg/mL injection 40 mg (has no administration in time range)  diphenhydrAMINE (BENADRYL) capsule 50 mg (has no administration in time range)    Or  diphenhydrAMINE (BENADRYL) injection 50 mg (has no administration in time range)     IMPRESSION / MDM / ASSESSMENT AND PLAN / ED COURSE  I reviewed the triage vital signs and the nursing notes.  Patient presents to the emergency department for chest pain since 630 this morning radiating to her back.  Patient states the majority of the pain is now located between her shoulder blades in the back.  Somewhat worse with movement or deep inspiration.  No leg pain or swelling.  No fever.  Patient's work-up thus far reassuring.  EKG is normal, chest x-ray is reassuring.  Lab work shows a negative troponin, normal chemistry and a normal CBC.  However  given the location of the pain worse with deep inspiration we will obtain CTA imaging of the chest to rule out pulmonary embolism.  Patient states a contrast allergy but states she has had CT scans in the past with the steroid prep.  We will do a steroid/Benadryl prep for urgent CT imaging.  We will treat pain while awaiting CTA results.  Patient agreeable to plan of care.  Patient's chemistry is normal CBC is normal.  Troponin negative.  Patient CT of the chest is negative as well.  Given the patient's reassuring work-up we will discharge with PCP follow-up.  Patient agreeable to plan of care.  FINAL CLINICAL IMPRESSION(S) / ED DIAGNOSES   Chest pain   Note:  This document was prepared using Dragon voice recognition software and may include unintentional dictation errors.   Harvest Dark, MD 05/15/22 1901

## 2022-05-15 NOTE — ED Notes (Addendum)
CT called and notified of benadryl administration

## 2022-06-05 ENCOUNTER — Encounter: Payer: Self-pay | Admitting: *Deleted

## 2022-06-08 ENCOUNTER — Other Ambulatory Visit: Payer: Self-pay | Admitting: Primary Care

## 2022-06-08 DIAGNOSIS — F419 Anxiety disorder, unspecified: Secondary | ICD-10-CM

## 2022-06-08 DIAGNOSIS — E039 Hypothyroidism, unspecified: Secondary | ICD-10-CM

## 2022-06-08 MED ORDER — LEVOTHYROXINE SODIUM 150 MCG PO TABS: ORAL_TABLET | ORAL | 2 refills | Status: DC

## 2022-06-08 MED ORDER — SERTRALINE HCL 100 MG PO TABS: 100.0000 mg | ORAL_TABLET | Freq: Every day | ORAL | 2 refills | Status: DC

## 2022-06-18 ENCOUNTER — Telehealth: Payer: Self-pay | Admitting: Primary Care

## 2022-06-18 NOTE — Telephone Encounter (Signed)
Placed in box for review.  

## 2022-06-18 NOTE — Telephone Encounter (Signed)
Completed and placed in Joellen's inbox. 

## 2022-06-18 NOTE — Telephone Encounter (Signed)
Patient came in office for PCP to fill out paperwork that is a physical form for her job and has been placed in PCP folder.

## 2022-06-19 NOTE — Telephone Encounter (Signed)
Called patient voice mail full. Placed form in my holding folder. Need to know if she would like to come by and pick up or have mailed.

## 2022-06-22 NOTE — Telephone Encounter (Signed)
Patient is calling in, states she will pick it up sometime this week.

## 2022-06-22 NOTE — Telephone Encounter (Signed)
Original left at reception for pick up copy sent to scan. No further action needed at this time.

## 2022-06-26 ENCOUNTER — Other Ambulatory Visit: Payer: Self-pay | Admitting: Primary Care

## 2022-06-26 DIAGNOSIS — Z1231 Encounter for screening mammogram for malignant neoplasm of breast: Secondary | ICD-10-CM

## 2022-07-02 ENCOUNTER — Ambulatory Visit
Admission: RE | Admit: 2022-07-02 | Discharge: 2022-07-02 | Disposition: A | Discharge status: Home / Self Care | Payer: No Typology Code available for payment source | Source: Ambulatory Visit | Attending: Primary Care | Admitting: Primary Care

## 2022-07-02 ENCOUNTER — Ambulatory Visit (INDEPENDENT_AMBULATORY_CARE_PROVIDER_SITE_OTHER): Payer: No Typology Code available for payment source

## 2022-07-02 DIAGNOSIS — Z1231 Encounter for screening mammogram for malignant neoplasm of breast: Secondary | ICD-10-CM

## 2022-07-02 DIAGNOSIS — Z23 Encounter for immunization: Secondary | ICD-10-CM | POA: Diagnosis not present

## 2022-07-02 NOTE — Progress Notes (Signed)
Pt came in today to receive her 2nd shingles vaccine. Pt was given  injection in the Left deltoid successfully w/o any concerns or issues

## 2022-07-03 ENCOUNTER — Ambulatory Visit
Admission: RE | Admit: 2022-07-03 | Discharge: 2022-07-03 | Disposition: A | Discharge status: Home / Self Care | Payer: No Typology Code available for payment source | Source: Ambulatory Visit | Attending: Primary Care | Admitting: Primary Care

## 2022-07-03 DIAGNOSIS — R911 Solitary pulmonary nodule: Secondary | ICD-10-CM

## 2022-08-18 ENCOUNTER — Encounter: Payer: No Typology Code available for payment source | Admitting: Neurology

## 2023-01-14 ENCOUNTER — Ambulatory Visit: Payer: No Typology Code available for payment source

## 2023-01-14 ENCOUNTER — Telehealth: Payer: Self-pay | Admitting: Primary Care

## 2023-01-14 ENCOUNTER — Telehealth: Payer: Self-pay

## 2023-01-14 NOTE — Telephone Encounter (Signed)
Patient dropped off document to be filled out by provider  health clearance form . Patient requested to send it via Call Patient to pick up within 2-days. Document is located in providers tray at front office.

## 2023-01-14 NOTE — Telephone Encounter (Signed)
Form placed in [place] box for review and completion

## 2023-01-14 NOTE — Telephone Encounter (Signed)
Noted. Agree that she should proceed with egg free vaccine if she has any allergy to eggs.

## 2023-01-14 NOTE — Telephone Encounter (Signed)
Pt came to office as nurse visit for flu shot; when In room pt gave pt identifiers and I asked if pt was here for regular flu shot; pt asked if we had the egg free flu shot and I asked pt if she was allergic to eggs. 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 Pt said that she worked for [employer] and that she had always gotten egg free flu shot and if they did not have egg free she might have gotten regular flu shot but eggs nauseate pt. Pt said she has eaten eggs but does have nausea so pt does not usually eat eggs. I spoke with Gentry Fitz NP and if pt is allergic to eggs then should get egg free flu shot. I advised pt of [place] instructions and pt said she did not say she was allergic to eggs and I respectfully said that when I asked her if she was allergic to eggs she replied yes. Pt shook her head and started walking out of room and said she was going to [facility] to get the flu shot. I apologized to pt but did not want to cause her harm by not giving an egg free flu shot. (We do not have egg free flu shots at [facility]). Pt said that was OK as she was walking down hall to lobby door. Sending FYI to Gentry Fitz NP as PCP. I also updated pts allergies/contraindications list with egg allergy.

## 2023-01-15 NOTE — Telephone Encounter (Signed)
Patient notified, form is placed with reception.

## 2023-01-15 NOTE — Telephone Encounter (Signed)
Completed and placed in Kelli's inbox. 

## 2023-01-18 ENCOUNTER — Telehealth: Payer: Self-pay | Admitting: Primary Care

## 2023-01-18 NOTE — Telephone Encounter (Signed)
Called patient back she states that she needs letter stating that she had employment physical with her PCP and the date that she had done. She states we have done one in the past but it has a specific agency name on it and she is trying to get some work with other agency's. She just needs it to be generic without a specific agency listed.

## 2023-01-18 NOTE — Telephone Encounter (Signed)
Pt came to office to pick up ppw. Pt is asking can a print off be provided to her from her recent cpe, for her records? Call back # [phone]

## 2023-01-19 NOTE — Telephone Encounter (Signed)
Printed and placed in [place] for pick up.

## 2023-01-19 NOTE — Telephone Encounter (Signed)
Left voicemail notifying patient ppw is ready to be picked up with reception.

## 2023-02-01 NOTE — Telephone Encounter (Signed)
Shingrx: 07/02/22, 02/03/22

## 2024-12-06 ENCOUNTER — Ambulatory Visit: Payer: Self-pay | Admitting: Primary Care

## 2024-12-06 ENCOUNTER — Ambulatory Visit
Admission: RE | Admit: 2024-12-06 | Discharge: 2024-12-06 | Disposition: A | Discharge status: Home / Self Care | Source: Ambulatory Visit | Attending: Primary Care | Admitting: Primary Care

## 2024-12-06 ENCOUNTER — Encounter: Payer: Self-pay | Admitting: Primary Care

## 2024-12-06 ENCOUNTER — Telehealth: Payer: Self-pay | Admitting: Primary Care

## 2024-12-06 VITALS — BP 130/72 | HR 95 | Temp 98.2°F | Ht 63.0 in | Wt 217.0 lb

## 2024-12-06 DIAGNOSIS — D509 Iron deficiency anemia, unspecified: Secondary | ICD-10-CM

## 2024-12-06 DIAGNOSIS — Z23 Encounter for immunization: Secondary | ICD-10-CM | POA: Diagnosis not present

## 2024-12-06 DIAGNOSIS — Z1231 Encounter for screening mammogram for malignant neoplasm of breast: Secondary | ICD-10-CM | POA: Diagnosis not present

## 2024-12-06 DIAGNOSIS — I10 Essential (primary) hypertension: Secondary | ICD-10-CM | POA: Diagnosis not present

## 2024-12-06 DIAGNOSIS — R0609 Other forms of dyspnea: Secondary | ICD-10-CM

## 2024-12-06 DIAGNOSIS — Z0001 Encounter for general adult medical examination with abnormal findings: Secondary | ICD-10-CM

## 2024-12-06 DIAGNOSIS — R911 Solitary pulmonary nodule: Secondary | ICD-10-CM

## 2024-12-06 DIAGNOSIS — E559 Vitamin D deficiency, unspecified: Secondary | ICD-10-CM | POA: Diagnosis not present

## 2024-12-06 DIAGNOSIS — J4530 Mild persistent asthma, uncomplicated: Secondary | ICD-10-CM

## 2024-12-06 DIAGNOSIS — K219 Gastro-esophageal reflux disease without esophagitis: Secondary | ICD-10-CM | POA: Diagnosis not present

## 2024-12-06 DIAGNOSIS — E039 Hypothyroidism, unspecified: Secondary | ICD-10-CM

## 2024-12-06 DIAGNOSIS — G4733 Obstructive sleep apnea (adult) (pediatric): Secondary | ICD-10-CM | POA: Diagnosis not present

## 2024-12-06 DIAGNOSIS — F419 Anxiety disorder, unspecified: Secondary | ICD-10-CM

## 2024-12-06 LAB — COMPREHENSIVE METABOLIC PANEL WITH GFR
ALT: 21 U/L (ref 3–35)
AST: 16 U/L (ref 5–37)
Albumin: 4.3 g/dL (ref 3.5–5.2)
Alkaline Phosphatase: 72 U/L (ref 39–117)
BUN: 11 mg/dL (ref 6–23)
CO2: 28 meq/L (ref 19–32)
Calcium: 9.2 mg/dL (ref 8.4–10.5)
Chloride: 106 meq/L (ref 96–112)
Creatinine, Ser: 0.84 mg/dL (ref 0.40–1.20)
GFR: 77.65 mL/min (ref >60.00)
Glucose, Bld: 93 mg/dL (ref 70–99)
Potassium: 3.9 meq/L (ref 3.5–5.1)
Sodium: 142 meq/L (ref 135–145)
Total Bilirubin: 0.3 mg/dL (ref 0.2–1.2)
Total Protein: 7.3 g/dL (ref 6.0–8.3)

## 2024-12-06 LAB — CBC
HCT: 36 % (ref 36.0–46.0)
Hemoglobin: 12 g/dL (ref 12.0–15.0)
MCHC: 33.3 g/dL (ref 30.0–36.0)
MCV: 83.2 fl (ref 78.0–100.0)
Platelets: 421 K/uL — ABNORMAL HIGH (ref 150.0–400.0)
RBC: 4.32 Mil/uL (ref 3.87–5.11)
RDW: 14 % (ref 11.5–15.5)
WBC: 6 K/uL (ref 4.0–10.5)

## 2024-12-06 LAB — LIPID PANEL
Cholesterol: 184 mg/dL (ref 28–200)
HDL: 45.6 mg/dL (ref >39.00)
LDL Cholesterol: 108 mg/dL — ABNORMAL HIGH (ref 10–99)
NonHDL: 138.44
Total CHOL/HDL Ratio: 4
Triglycerides: 151 mg/dL — ABNORMAL HIGH (ref 10.0–149.0)
VLDL: 30.2 mg/dL (ref 0.0–40.0)

## 2024-12-06 LAB — HEMOGLOBIN A1C: Hgb A1c MFr Bld: 5.9 % (ref 4.6–6.5)

## 2024-12-06 LAB — IBC + FERRITIN
Ferritin: 57.8 ng/mL (ref 10.0–291.0)
Iron: 79 ug/dL (ref 42–145)
Saturation Ratios: 23.4 % (ref 20.0–50.0)
TIBC: 337.4 ug/dL (ref 250.0–450.0)
Transferrin: 241 mg/dL (ref 212.0–360.0)

## 2024-12-06 LAB — VITAMIN D 25 HYDROXY (VIT D DEFICIENCY, FRACTURES): VITD: 14.75 ng/mL — ABNORMAL LOW (ref 30.00–100.00)

## 2024-12-06 LAB — BRAIN NATRIURETIC PEPTIDE: Pro B Natriuretic peptide (BNP): 12 pg/mL (ref 1.0–100.0)

## 2024-12-06 LAB — TSH: TSH: 16.79 u[IU]/mL — ABNORMAL HIGH (ref 0.35–5.50)

## 2024-12-06 MED ORDER — FLUTICASONE FUROATE-VILANTEROL 100-25 MCG/ACT IN AEPB: 1.0000 | INHALATION_SPRAY | Freq: Every day | RESPIRATORY_TRACT | 3 refills | Status: DC

## 2024-12-06 MED ORDER — MOMETASONE FURO-FORMOTEROL FUM 100-5 MCG/ACT IN AERO: 2.0000 | INHALATION_SPRAY | Freq: Two times a day (BID) | RESPIRATORY_TRACT | 5 refills | Status: AC

## 2024-12-06 NOTE — Assessment & Plan Note (Signed)
 Unclear if her TSH is well-managed. Repeat TSH pending.  Continue levothyroxine  150 mcg tablets daily for now.

## 2024-12-06 NOTE — Assessment & Plan Note (Signed)
 No concerns today  Remain off Zoloft 

## 2024-12-06 NOTE — Assessment & Plan Note (Signed)
 Referral placed to pulmonology to get in for routine follow-up.  Continue CPAP nightly.

## 2024-12-06 NOTE — Assessment & Plan Note (Signed)
 Determined benign, no follow-up imaging required.

## 2024-12-06 NOTE — Assessment & Plan Note (Signed)
 Immunizations UTD. Influenza vaccine provided today.  Mammogram due, orders placed. Colonoscopy UTD, due 2031  Discussed the importance of a healthy diet and regular exercise in order for weight loss, and to reduce the risk of further co-morbidity.  Exam stable. Labs pending.  Follow up in 1 year for repeat physical.

## 2024-12-06 NOTE — Telephone Encounter (Signed)
 Please call patient:  I sent a new prescription for Dulera inhaler. It's 2 puffs BID. Have her contact us  if it is too expensive/insurance not covering.

## 2024-12-06 NOTE — Assessment & Plan Note (Signed)
 Could be uncontrolled based on HPI today.  Resume Breo Ellipta  inhaler 100-25 mg, one puff once daily. Will add extra testing, see notes under exertional dyspnea.

## 2024-12-06 NOTE — Assessment & Plan Note (Signed)
 Differentials include CHF, asthma, anemia, deconditioning, CAD.  Chest x-ray ordered and pending. EKG today with normal sinus rhythm, no acute ST changes, no PACs/PVCs.  Aside from artifact, it appears similar to EKG from 2023  Labs pending today including BNP, CMP, TSH, CBC, iron studies  Resume Breo Ellipta  inhaler, 1 puff daily.  Prescription sent to pharmacy Consider cardiology referral.  Await results

## 2024-12-06 NOTE — Telephone Encounter (Signed)
Spoke with pt relaying Kate's message.  Pt verbalizes understanding.

## 2024-12-06 NOTE — Assessment & Plan Note (Signed)
 No concerns today. Continue to monitor.

## 2024-12-06 NOTE — Addendum Note (Signed)
 Addended by: Jori Thrall K on: 12/06/2024 03:30 PM   Modules accepted: Orders

## 2024-12-06 NOTE — Patient Instructions (Addendum)
 Stop by the lab prior to leaving today. I will notify you of your results once received.   Resume Breo Ellipta  inhaler.  Inhale 1 puff into the lungs once daily.  Please notify me if the inhaler is too expensive  You will either be contacted via phone regarding your referral to the pulmonologist for CPAP, or you may receive a letter on your MyChart portal from our referral team with instructions for scheduling an appointment. Please let us  know if you have not been contacted by anyone within two weeks.  [facility] to schedule your mamm[facility].   It was a pleasure to see you today!

## 2024-12-06 NOTE — Telephone Encounter (Signed)
 Copied from CRM #[id number]. Topic: Clinical - Prescription Issue >> Dec 06, 2024 11:40 AM Anairis L wrote: Reason for CRM: fluticasone  furoate-vilanterol (BREO ELLIPTA ) 100-25 MCG/ACT AEPB [[id number]] over $400 dollars.

## 2024-12-06 NOTE — Assessment & Plan Note (Signed)
Repeat vitamin D level pending. 
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
## 2024-12-06 NOTE — Assessment & Plan Note (Addendum)
 CBC and iron studies ordered and pending, especially in the setting of exertional dyspnea.

## 2024-12-06 NOTE — Assessment & Plan Note (Signed)
 Controlled, also with home readings.  Remain off amlodipine -olmesartan  10-20 mg

## 2024-12-07 ENCOUNTER — Ambulatory Visit: Payer: Self-pay | Admitting: Primary Care

## 2024-12-07 DIAGNOSIS — E039 Hypothyroidism, unspecified: Secondary | ICD-10-CM

## 2024-12-07 DIAGNOSIS — Z1231 Encounter for screening mammogram for malignant neoplasm of breast: Secondary | ICD-10-CM

## 2024-12-07 DIAGNOSIS — E559 Vitamin D deficiency, unspecified: Secondary | ICD-10-CM

## 2024-12-07 MED ORDER — LEVOTHYROXINE SODIUM 150 MCG PO TABS: ORAL_TABLET | ORAL | 0 refills | Status: AC

## 2024-12-26 MED ORDER — VITAMIN D (ERGOCALCIFEROL) 1.25 MG (50000 UNIT) PO CAPS: ORAL_CAPSULE | ORAL | 0 refills | Status: AC

## 2024-12-26 NOTE — Telephone Encounter (Signed)
 Awesome, thanks.

## 2024-12-26 NOTE — Telephone Encounter (Signed)
 Lisa Mullen,  This patient is in need of a maintenance inhaler for her asthma.  I have prescribed several LABA/ICS inhalers for which either her insurance will not cover or are cost prohibitive despite insurance.  Can we look into getting her on patient assistance for any of the LABA/ICS inhalers?  Let me know if I need to place a referral.   Thanks!

## 2024-12-28 ENCOUNTER — Encounter: Payer: Self-pay | Admitting: Sleep Medicine

## 2024-12-28 ENCOUNTER — Other Ambulatory Visit: Payer: Self-pay

## 2024-12-28 ENCOUNTER — Ambulatory Visit (INDEPENDENT_AMBULATORY_CARE_PROVIDER_SITE_OTHER): Admitting: Sleep Medicine

## 2024-12-28 VITALS — BP 130/80 | HR 73 | Temp 98.1°F | Ht 63.0 in | Wt 214.8 lb

## 2024-12-28 DIAGNOSIS — G4726 Circadian rhythm sleep disorder, shift work type: Secondary | ICD-10-CM

## 2024-12-28 DIAGNOSIS — Z87891 Personal history of nicotine dependence: Secondary | ICD-10-CM

## 2024-12-28 DIAGNOSIS — G4733 Obstructive sleep apnea (adult) (pediatric): Secondary | ICD-10-CM | POA: Diagnosis not present

## 2024-12-28 DIAGNOSIS — E669 Obesity, unspecified: Secondary | ICD-10-CM | POA: Diagnosis not present

## 2024-12-28 DIAGNOSIS — Z6838 Body mass index (BMI) 38.0-38.9, adult: Secondary | ICD-10-CM

## 2024-12-28 DIAGNOSIS — R0609 Other forms of dyspnea: Secondary | ICD-10-CM

## 2024-12-28 NOTE — Patient Instructions (Signed)
 SABRA
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
## 2025-01-03 ENCOUNTER — Ambulatory Visit

## 2025-01-03 DIAGNOSIS — Z23 Encounter for immunization: Secondary | ICD-10-CM

## 2025-01-03 NOTE — Progress Notes (Signed)
 Per orders of Mallie Gaskins, DPN AGNP-C, injection of prevnar 20 IM rt deltoid and Heplisav -B in lt deltoid given by Laray Arenas in bilaterally deltoid. Patient tolerated injection well. Patient will make appointment for 1 month.

## 2025-01-11 ENCOUNTER — Encounter

## 2025-01-11 ENCOUNTER — Other Ambulatory Visit (HOSPITAL_COMMUNITY): Payer: Self-pay

## 2025-01-11 DIAGNOSIS — G4733 Obstructive sleep apnea (adult) (pediatric): Secondary | ICD-10-CM

## 2025-01-17 ENCOUNTER — Ambulatory Visit
Admission: RE | Admit: 2025-01-17 | Discharge: 2025-01-17 | Disposition: A | Discharge status: Home / Self Care | Source: Ambulatory Visit | Attending: Primary Care | Admitting: Primary Care

## 2025-01-17 ENCOUNTER — Ambulatory Visit: Payer: Self-pay | Admitting: Primary Care

## 2025-01-17 ENCOUNTER — Other Ambulatory Visit (HOSPITAL_COMMUNITY): Payer: Self-pay

## 2025-01-17 DIAGNOSIS — Z1231 Encounter for screening mammogram for malignant neoplasm of breast: Secondary | ICD-10-CM | POA: Diagnosis present

## 2025-01-18 ENCOUNTER — Ambulatory Visit: Admitting: Internal Medicine

## 2025-01-19 ENCOUNTER — Telehealth: Payer: Self-pay | Admitting: Pharmacist

## 2025-01-19 ENCOUNTER — Other Ambulatory Visit (HOSPITAL_COMMUNITY): Payer: Self-pay

## 2025-01-19 NOTE — Progress Notes (Signed)
 Brief Telephone Documentation Reason for Call: Inhaler cost   Summary of Call: No answer, left voicemail with direct callback number.  MyChart message sent.   Inhaler not affordable. Unable to pull any insurance information. Will need to confirm if patient is uninsured, or has a 2026 prescription insurance card.   Follow Up: Patient given direct line via voicemail and Mychart.   Manuelita FABIENE Kobs, PharmD, BCACP, CPP Clinical Pharmacist Practitioner [facility] Ph: [phone]

## 2025-02-15 ENCOUNTER — Ambulatory Visit: Admitting: Internal Medicine
